# Patient Record
Sex: Male | Born: 1970 | Race: Black or African American | Hispanic: No | Marital: Single | Smoking: Current every day smoker
Health system: Southern US, Community
[De-identification: ages and names within clinical notes are randomized; demographics above are authoritative.]

## PROBLEM LIST (undated history)

## (undated) DIAGNOSIS — R05 Cough: Secondary | ICD-10-CM

## (undated) DIAGNOSIS — R011 Cardiac murmur, unspecified: Secondary | ICD-10-CM

## (undated) DIAGNOSIS — N62 Hypertrophy of breast: Secondary | ICD-10-CM

## (undated) DIAGNOSIS — R61 Generalized hyperhidrosis: Secondary | ICD-10-CM

## (undated) DIAGNOSIS — B20 Human immunodeficiency virus [HIV] disease: Principal | ICD-10-CM

## (undated) DIAGNOSIS — F17200 Nicotine dependence, unspecified, uncomplicated: Secondary | ICD-10-CM

## (undated) HISTORY — DX: Cardiac murmur, unspecified: R01.1

## (undated) HISTORY — DX: Cough: R05

## (undated) HISTORY — DX: Generalized hyperhidrosis: R61

## (undated) HISTORY — DX: Nicotine dependence, unspecified, uncomplicated: F17.200

## (undated) HISTORY — DX: Hypertrophy of breast: N62

## (undated) HISTORY — DX: Human immunodeficiency virus (HIV) disease: B20

---

## 2002-10-01 ENCOUNTER — Encounter: Payer: Self-pay | Admitting: Emergency Medicine

## 2002-10-01 ENCOUNTER — Emergency Department (HOSPITAL_COMMUNITY): Admission: EM | Admit: 2002-10-01 | Discharge: 2002-10-01 | Payer: Self-pay | Admitting: Emergency Medicine

## 2005-08-12 ENCOUNTER — Ambulatory Visit: Payer: Self-pay | Admitting: Internal Medicine

## 2005-08-13 ENCOUNTER — Ambulatory Visit: Payer: Self-pay | Admitting: *Deleted

## 2005-08-29 ENCOUNTER — Ambulatory Visit: Payer: Self-pay | Admitting: Internal Medicine

## 2005-09-18 ENCOUNTER — Ambulatory Visit: Payer: Self-pay | Admitting: Gastroenterology

## 2005-09-27 ENCOUNTER — Ambulatory Visit: Payer: Self-pay | Admitting: Gastroenterology

## 2005-10-02 ENCOUNTER — Ambulatory Visit: Payer: Self-pay | Admitting: Gastroenterology

## 2005-10-04 ENCOUNTER — Ambulatory Visit: Payer: Self-pay | Admitting: Gastroenterology

## 2006-11-17 ENCOUNTER — Encounter: Payer: Self-pay | Admitting: *Deleted

## 2006-11-17 DIAGNOSIS — J309 Allergic rhinitis, unspecified: Secondary | ICD-10-CM | POA: Insufficient documentation

## 2007-04-26 IMAGING — CT CT PELVIS W/ CM
2 of 5 series · 17 of 46 positions shown, 19 images · IV contrast (APPLIED)
Comparison: none

CLINICAL DATA: History of abdominal pain.
 ABDOMEN CT WITH CONTRAST:
TECHNIQUE: Multidetector CT imaging of the abdomen was performed following the standard protocol during bolus administration of intravenous contrast.
 Contrast:  100 cc Omnipaque 300.
TECHNIQUE: Multidetector CT imaging of the pelvis was performed following the standard protocol during bolus administration of intravenous contrast.

[Series 2: abd_pel 5.0 b30f st · axial · 0.64mm/px · z∈[-467,-67]mm · 14 of 90 slices shown, 16 images]
[im 5/90  soft-tissue]
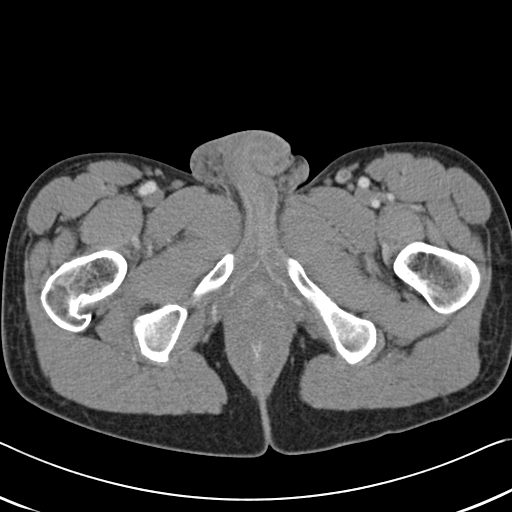
[im 5/90  bone]
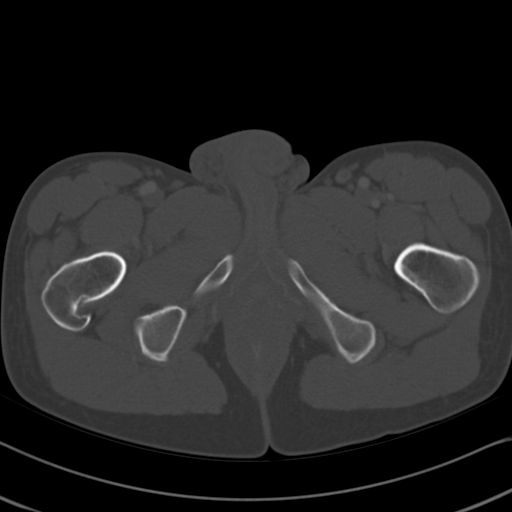
[im 10/90  soft-tissue]
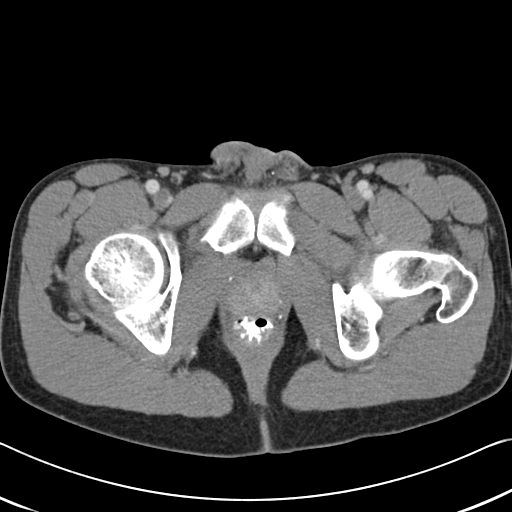
[im 20/90  soft-tissue]
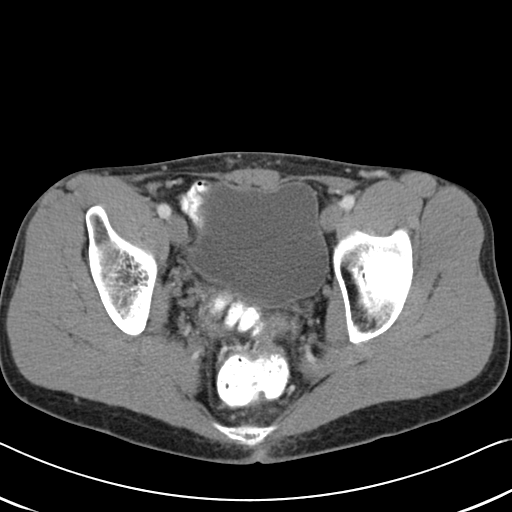
[im 25/90  soft-tissue]
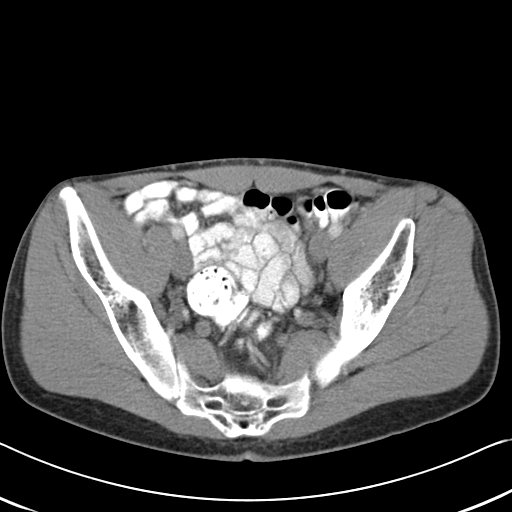
[im 30/90  soft-tissue]
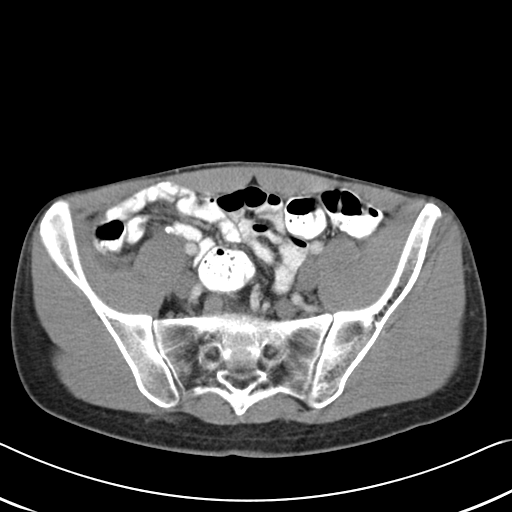
[im 35/90  soft-tissue]
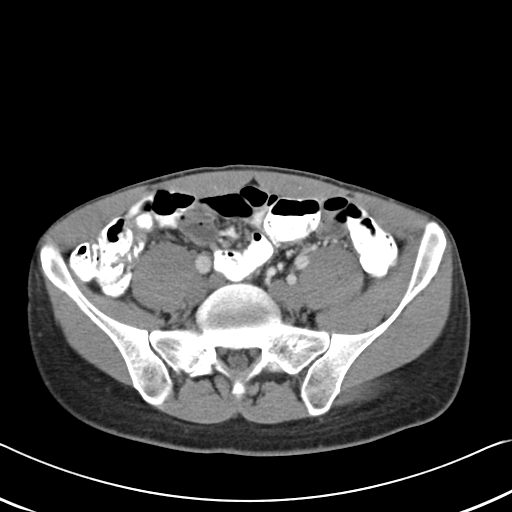
[im 40/90  soft-tissue]
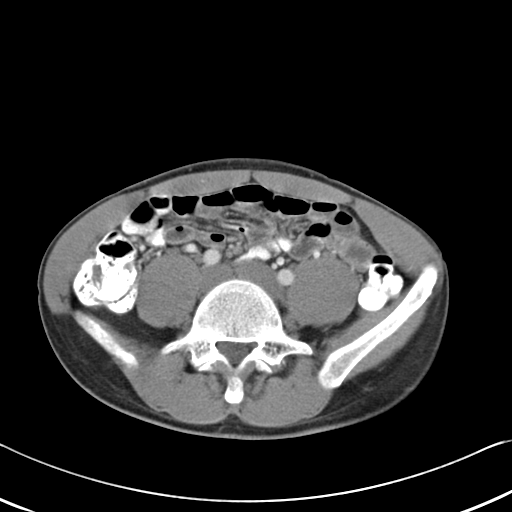
[im 50/90  soft-tissue]
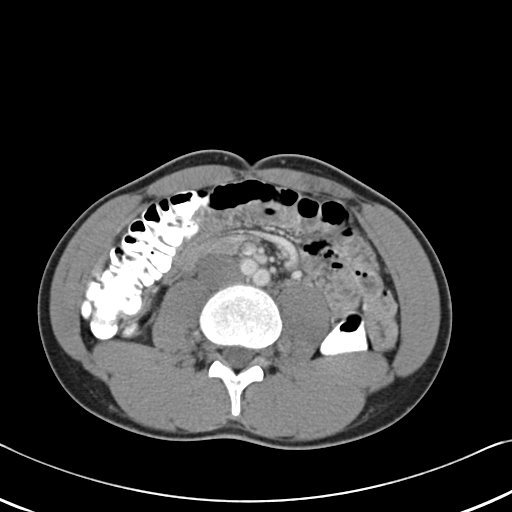
[im 55/90  soft-tissue]
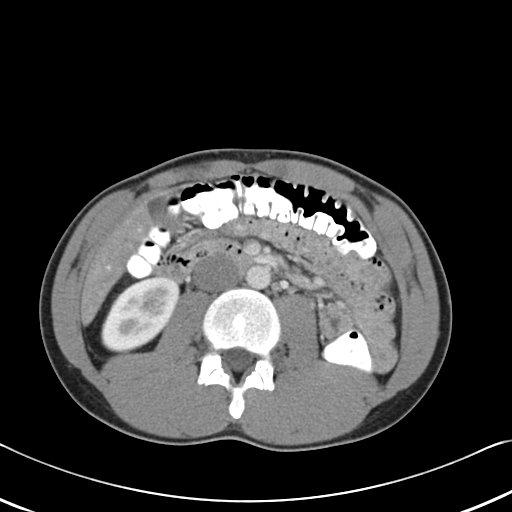
[im 55/90  bone]
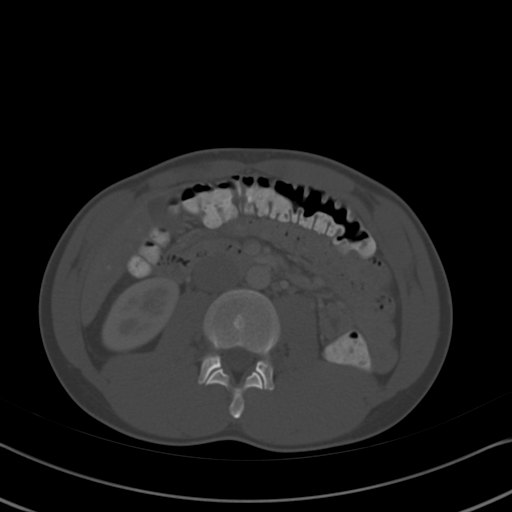
[im 60/90  soft-tissue]
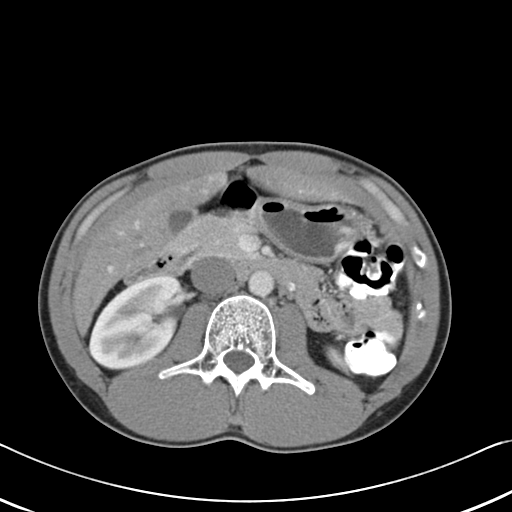
[im 65/90  soft-tissue]
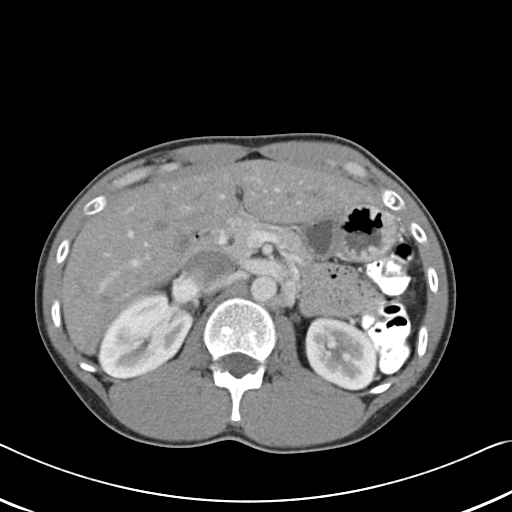
[im 70/90  soft-tissue]
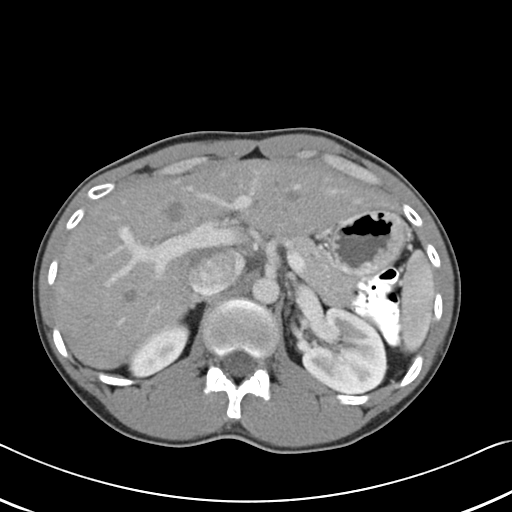
[im 80/90  soft-tissue]
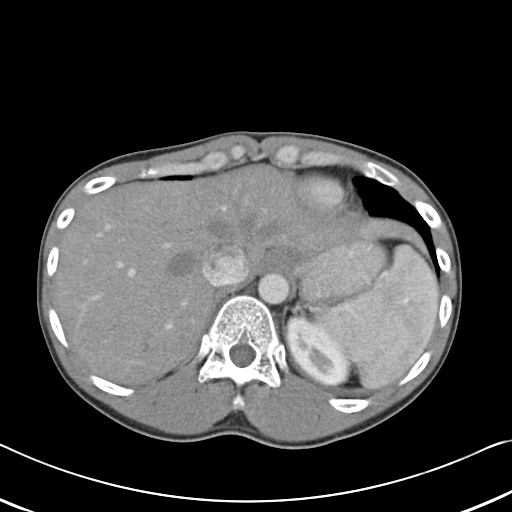
[im 85/90  soft-tissue]
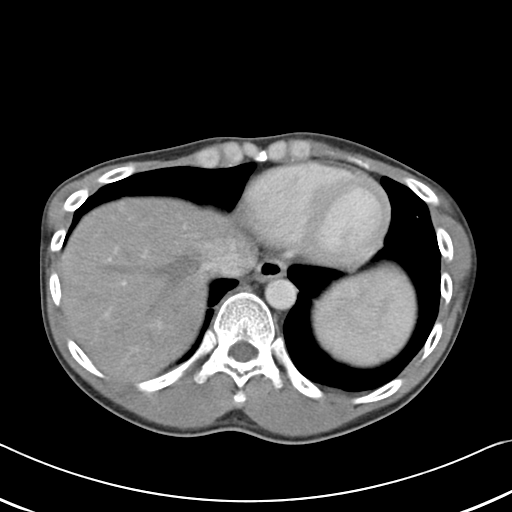

[Series 602: <mpr range> · coronal · 0.88mm/px · 3 of 39 slices shown]
[im 13/39  soft-tissue]
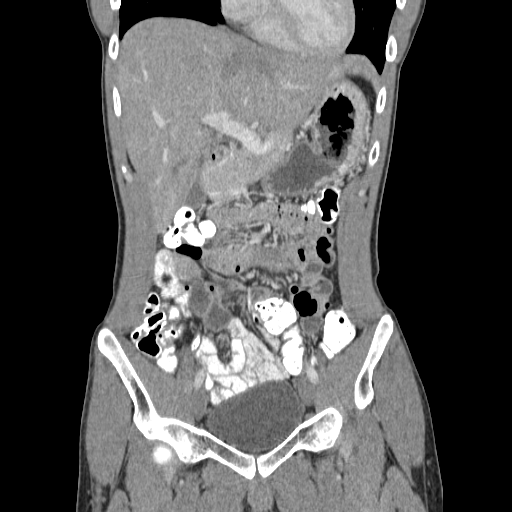
[im 17/39  soft-tissue]
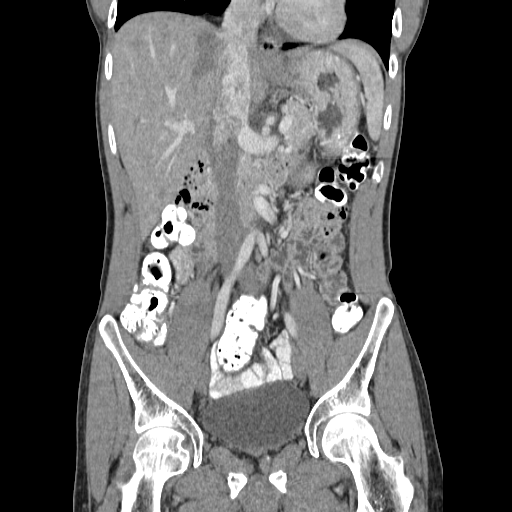
[im 22/39  soft-tissue]
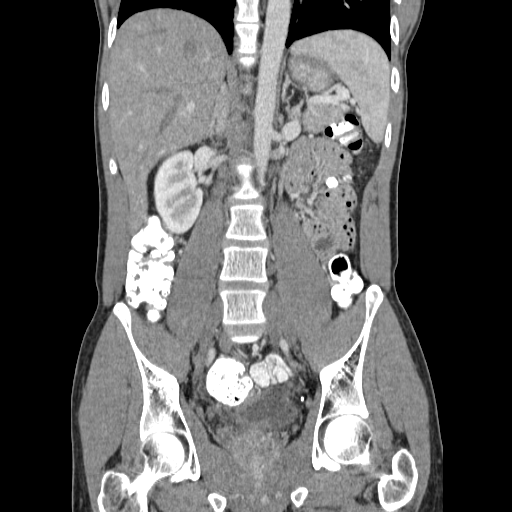

[17 of 46 positions shown; findings below may reference images not displayed]

FINDINGS: The lung bases are clear.  The liver enhances with no focal abnormality, and no ductal dilatation is seen.  No calcified gallstones are noted.  The pancreas is normal in size with normal peripancreatic fat planes.  The adrenal glands and spleen appear normal.  The kidneys enhance normally, and on delayed images the pelvicaliceal systems appear normal.  The abdominal aorta is normal in caliber.  No adenopathy is seen.
IMPRESSION: Negative CT of the abdomen.
 PELVIS CT WITH CONTRAST:
FINDINGS: Scans were continued through the pelvis after oral and IV contrast were given.  No abnormality of the bowel is seen.  The urinary bladder is unremarkable.  The prostate is normal in size.  No pelvic mass or fluid is seen.  
 The appendix and terminal ileum appear normal.
IMPRESSION: Negative CT of the pelvis.  The appendix and terminal ileum appear normal.

## 2016-05-06 ENCOUNTER — Ambulatory Visit: Payer: Self-pay

## 2016-05-06 ENCOUNTER — Other Ambulatory Visit: Payer: Self-pay

## 2016-05-07 ENCOUNTER — Other Ambulatory Visit: Payer: Self-pay

## 2016-05-09 ENCOUNTER — Other Ambulatory Visit (INDEPENDENT_AMBULATORY_CARE_PROVIDER_SITE_OTHER): Payer: Self-pay

## 2016-05-09 DIAGNOSIS — Z79899 Other long term (current) drug therapy: Secondary | ICD-10-CM

## 2016-05-09 DIAGNOSIS — B20 Human immunodeficiency virus [HIV] disease: Secondary | ICD-10-CM

## 2016-05-09 DIAGNOSIS — Z113 Encounter for screening for infections with a predominantly sexual mode of transmission: Secondary | ICD-10-CM

## 2016-05-09 LAB — CBC WITH DIFFERENTIAL/PLATELET
BASOS ABS: 0 {cells}/uL (ref 0–200)
Basophils Relative: 0 %
EOS PCT: 0 %
Eosinophils Absolute: 0 cells/uL — ABNORMAL LOW (ref 15–500)
HCT: 36.3 % — ABNORMAL LOW (ref 38.5–50.0)
Hemoglobin: 12.8 g/dL — ABNORMAL LOW (ref 13.2–17.1)
Lymphocytes Relative: 29 %
Lymphs Abs: 928 cells/uL (ref 850–3900)
MCH: 34.7 pg — AB (ref 27.0–33.0)
MCHC: 35.3 g/dL (ref 32.0–36.0)
MCV: 98.4 fL (ref 80.0–100.0)
MONOS PCT: 9 %
MPV: 8.9 fL (ref 7.5–12.5)
Monocytes Absolute: 288 cells/uL (ref 200–950)
Neutro Abs: 1984 cells/uL (ref 1500–7800)
Neutrophils Relative %: 62 %
PLATELETS: 142 10*3/uL (ref 140–400)
RBC: 3.69 MIL/uL — ABNORMAL LOW (ref 4.20–5.80)
RDW: 15.3 % — AB (ref 11.0–15.0)
WBC: 3.2 10*3/uL — ABNORMAL LOW (ref 3.8–10.8)

## 2016-05-10 LAB — COMPLETE METABOLIC PANEL WITH GFR
ALK PHOS: 175 U/L — AB (ref 40–115)
ALT: 27 U/L (ref 9–46)
AST: 74 U/L — ABNORMAL HIGH (ref 10–40)
Albumin: 4.5 g/dL (ref 3.6–5.1)
BUN: 8 mg/dL (ref 7–25)
CO2: 25 mmol/L (ref 20–31)
Calcium: 9.5 mg/dL (ref 8.6–10.3)
Chloride: 101 mmol/L (ref 98–110)
Creat: 0.88 mg/dL (ref 0.60–1.35)
GFR, Est African American: >89 mL/min (ref >=60)
GLUCOSE: 96 mg/dL (ref 65–99)
POTASSIUM: 3.9 mmol/L (ref 3.5–5.3)
SODIUM: 135 mmol/L (ref 135–146)
Total Bilirubin: 0.4 mg/dL (ref 0.2–1.2)
Total Protein: 8.6 g/dL — ABNORMAL HIGH (ref 6.1–8.1)

## 2016-05-10 LAB — T-HELPER CELL (CD4) - (RCID CLINIC ONLY)
CD4 T CELL ABS: 1020 /uL (ref 400–2700)
CD4 T CELL HELPER: 35 % (ref 33–55)

## 2016-05-10 LAB — RPR

## 2016-05-10 LAB — URINE CYTOLOGY ANCILLARY ONLY
CHLAMYDIA, DNA PROBE: NEGATIVE
Neisseria Gonorrhea: NEGATIVE

## 2016-05-10 LAB — HEPATITIS B SURFACE ANTIBODY,QUALITATIVE: Hep B S Ab: NEGATIVE

## 2016-05-10 LAB — LIPID PANEL
CHOLESTEROL: 181 mg/dL (ref <200)
HDL: 37 mg/dL — ABNORMAL LOW (ref >40)
TRIGLYCERIDES: 622 mg/dL — AB (ref <150)
Total CHOL/HDL Ratio: 4.9 Ratio (ref <5.0)

## 2016-05-10 LAB — HEPATITIS C ANTIBODY: HCV AB: NEGATIVE

## 2016-05-10 LAB — HEPATITIS B CORE ANTIBODY, TOTAL: HEP B C TOTAL AB: NONREACTIVE

## 2016-05-10 LAB — HEPATITIS B SURFACE ANTIGEN: Hepatitis B Surface Ag: NEGATIVE

## 2016-05-10 LAB — HEPATITIS A ANTIBODY, TOTAL: HEP A TOTAL AB: REACTIVE — AB

## 2016-05-11 LAB — QUANTIFERON TB GOLD ASSAY (BLOOD)
Interferon Gamma Release Assay: NEGATIVE
MITOGEN-NIL SO: 1.95 [IU]/mL
QUANTIFERON NIL VALUE: 0.04 [IU]/mL

## 2016-05-12 LAB — HIV-1 RNA,QN PCR W/REFLEX GENOTYPE
HIV-1 RNA, QN PCR: 2.7 Log cps/mL — ABNORMAL HIGH
HIV-1 RNA, QN PCR: 500 Copies/mL — ABNORMAL HIGH

## 2016-05-14 ENCOUNTER — Encounter: Payer: Self-pay | Admitting: Infectious Disease

## 2016-05-20 ENCOUNTER — Encounter: Payer: Self-pay | Admitting: Infectious Disease

## 2016-05-20 ENCOUNTER — Ambulatory Visit (INDEPENDENT_AMBULATORY_CARE_PROVIDER_SITE_OTHER): Payer: Self-pay | Admitting: Infectious Disease

## 2016-05-20 VITALS — BP 129/74 | HR 114 | Temp 98.6°F | Wt 152.0 lb

## 2016-05-20 DIAGNOSIS — B20 Human immunodeficiency virus [HIV] disease: Secondary | ICD-10-CM | POA: Insufficient documentation

## 2016-05-20 DIAGNOSIS — Z21 Asymptomatic human immunodeficiency virus [HIV] infection status: Secondary | ICD-10-CM

## 2016-05-20 DIAGNOSIS — F17200 Nicotine dependence, unspecified, uncomplicated: Secondary | ICD-10-CM | POA: Insufficient documentation

## 2016-05-20 DIAGNOSIS — R011 Cardiac murmur, unspecified: Secondary | ICD-10-CM

## 2016-05-20 DIAGNOSIS — R059 Cough, unspecified: Secondary | ICD-10-CM

## 2016-05-20 DIAGNOSIS — R634 Abnormal weight loss: Secondary | ICD-10-CM

## 2016-05-20 DIAGNOSIS — R61 Generalized hyperhidrosis: Secondary | ICD-10-CM

## 2016-05-20 DIAGNOSIS — R05 Cough: Secondary | ICD-10-CM

## 2016-05-20 HISTORY — DX: Generalized hyperhidrosis: R61

## 2016-05-20 HISTORY — DX: Nicotine dependence, unspecified, uncomplicated: F17.200

## 2016-05-20 HISTORY — DX: Asymptomatic human immunodeficiency virus (hiv) infection status: Z21

## 2016-05-20 HISTORY — DX: Cardiac murmur, unspecified: R01.1

## 2016-05-20 HISTORY — DX: Cough, unspecified: R05.9

## 2016-05-20 HISTORY — DX: Human immunodeficiency virus (HIV) disease: B20

## 2016-05-20 LAB — CBC WITH DIFFERENTIAL/PLATELET
BASOS PCT: 0 %
Basophils Absolute: 0 cells/uL (ref 0–200)
EOS PCT: 0 %
Eosinophils Absolute: 0 cells/uL — ABNORMAL LOW (ref 15–500)
HEMATOCRIT: 35.6 % — AB (ref 38.5–50.0)
Hemoglobin: 12.4 g/dL — ABNORMAL LOW (ref 13.2–17.1)
LYMPHS PCT: 35 %
Lymphs Abs: 1085 cells/uL (ref 850–3900)
MCH: 34.5 pg — ABNORMAL HIGH (ref 27.0–33.0)
MCHC: 34.8 g/dL (ref 32.0–36.0)
MCV: 99.2 fL (ref 80.0–100.0)
MONO ABS: 403 {cells}/uL (ref 200–950)
MONOS PCT: 13 %
MPV: 9.1 fL (ref 7.5–12.5)
Neutro Abs: 1612 cells/uL (ref 1500–7800)
Neutrophils Relative %: 52 %
PLATELETS: 167 10*3/uL (ref 140–400)
RBC: 3.59 MIL/uL — AB (ref 4.20–5.80)
RDW: 15.5 % — AB (ref 11.0–15.0)
WBC: 3.1 10*3/uL — AB (ref 3.8–10.8)

## 2016-05-20 MED ORDER — EMTRICITABINE-TENOFOVIR AF 200-25 MG PO TABS: 1.0000 | ORAL_TABLET | Freq: Every day | ORAL | 5 refills | Status: DC

## 2016-05-20 MED ORDER — DOLUTEGRAVIR SODIUM 50 MG PO TABS: 50.0000 mg | ORAL_TABLET | Freq: Every day | ORAL | 5 refills | Status: DC

## 2016-05-20 NOTE — Progress Notes (Signed)
HPI: Bradley Hamilton is a 46 y.o. male who presents to the [facility] clinic today to see Dr. Daiva Eves for his HIV.  Allergies: No Known Allergies  Past Medical History: Past Medical History:  Diagnosis Date  . Cough 05/20/2016  . Heart murmur 05/20/2016  . HIV infection (HCC) 05/20/2016  . Night sweats 05/20/2016  . Smoker 05/20/2016    Social History: Social History   Social History  . Marital status: Single    Spouse name: N/A  . Number of children: N/A  . Years of education: N/A   Social History Main Topics  . Smoking status: Current Every Day Smoker  . Smokeless tobacco: Never Used  . Alcohol use Yes  . Drug use: No  . Sexual activity: Not Currently   Other Topics Concern  . None   Social History Narrative  . None    Current Regimen: none  Labs: CD4 T Cell Abs (/uL)  Date Value  05/10/2016 1,020   Hep B S Ab (no units)  Date Value  05/09/2016 NEG   Hepatitis B Surface Ag (no units)  Date Value  05/09/2016 NEGATIVE   HCV Ab (no units)  Date Value  05/09/2016 NEGATIVE    CrCl: CrCl cannot be calculated (Unknown ideal weight.).  Lipids:    Component Value Date/Time   CHOL 181 05/09/2016 1609   TRIG 622 (H) 05/09/2016 1609   HDL 37 (L) 05/09/2016 1609   CHOLHDL 4.9 05/09/2016 1609   VLDL NOT CALC 05/09/2016 1609   LDLCALC NOT CALC 05/09/2016 1609    Assessment: Bradley Hamilton comes into clinic today to see Dr. Daiva Eves.  He has had HIV for 16 years but has never been on medications.  His viral load is only 500 and CD4 normal above 1,000. He is interested in starting medications now.  Explained to him the importance of not stopping his medications once he starts. Explained resistance to him and how it can develop if he goes on and off medications.  Also explained the importance of being on HIV medications to prevent long term inflammation and problems down the road. He is ready to start.  His ADAP is pending so we will do Tivicay and Descovy so he can go  through [employer].  We will change to [place] once his ADAP is approved [employer] does not have [place] on formulary currently). I filled out his [employer] application for him and told him to expect to hear from them soon. I will have him come back and see me in ~1 month to see how he is doing and get labs.  Plans: - Tivicay 50 mg PO once daily from [facility] - Descovy 200-25 mg PO once daily from [employer] - Change to [place] once ADAP approved - F/u with me 5/9 at 2pm  Zenaya Ulatowski L. Oluwademilade Mckiver, PharmD, CPP Infectious Diseases Clinical Pharmacist [facility] 05/20/2016, 3:21 PM

## 2016-05-20 NOTE — Progress Notes (Signed)
Chief complaint: night sweats   Subjective:    Patient ID: Bradley Hamilton, male    DOB: November 18, 1970, 46 y.o.   MRN: [id number]  HPI  46 year old [place] American man with HIV infection who moved from [place], [place] to [place] because famiily is here. He has had HIV infection x 45 years but never been on meds. His VL is only 500 and CD4 over 1k.  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  He is interested in starting on medications.   He is concerned because for past several months he has been suffering from night sweats where his pillow is soaked in sweat as is his chest. He does not believe he has lost weight but family have told him they think he has. He has cough as well but largely non productive.   On exam he has  46 y.o. A very loud systolic murmur that is palpable. He claims no one has ever told him he had a murmur.   Past Medical History:  Diagnosis Date  . Cough 05/20/2016  . Heart murmur 05/20/2016  . HIV infection ([place]) 05/20/2016  . Night sweats 05/20/2016  . Smoker 05/20/2016    No past surgical history on file.  No family history on file.    Social History   Social History  . Marital status: Single    Spouse name: N/A  . Number of children: N/A  . Years of education: N/A   Social History Main Topics  . Smoking status: Current Every Day Smoker  . Smokeless tobacco: Never Used  . Alcohol use Yes  . Drug use: No  . Sexual activity: Not Currently   Other Topics Concern  . None   Social History Narrative  . None    No Known Allergies   Current Outpatient Prescriptions:  .  dolutegravir (TIVICAY) 50 MG tablet, Take 1 tablet (50 mg total) by mouth daily., Disp: 30 tablet, Rfl: 5 .  emtricitabine-tenofovir AF (DESCOVY) 200-25 MG tablet, Take 1 tablet by mouth daily., Disp: 30 tablet, Rfl: 5   Review of Systems  Constitutional: Positive for diaphoresis and unexpected weight change. Negative for fever.  HENT:  Negative for congestion and sore throat.   Eyes: Negative for photophobia.  Respiratory: Positive for cough. Negative for shortness of breath and wheezing.   Cardiovascular: Negative for chest pain, palpitations and leg swelling.  Gastrointestinal: Negative for abdominal pain, blood in stool, constipation, diarrhea, nausea and vomiting.  Genitourinary: Negative for dysuria, flank pain and hematuria.  Musculoskeletal: Negative for back pain and myalgias.  Skin: Negative for rash.  Neurological: Negative for dizziness, weakness and headaches.  Hematological: Does not bruise/bleed easily.  Psychiatric/Behavioral: Negative for suicidal ideas.       Objective:   Physical Exam  Constitutional: He is oriented to person, place, and time. No distress.  HENT:  Head: Normocephalic and atraumatic.  Mouth/Throat: Oropharynx is clear and moist. No oropharyngeal exudate.  Eyes: EOM are normal. Pupils are equal, round, and reactive to light.  Neck: No tracheal deviation present. No thyromegaly present.  Cardiovascular: A regularly irregular rhythm present. Tachycardia present.   Murmur heard.  Systolic murmur is present with a grade of 4/6  Pulmonary/Chest: Effort normal and breath sounds normal. No respiratory distress. He has no wheezes. He has no rales. He exhibits no tenderness.  Abdominal: Soft. Bowel sounds are normal. He exhibits no distension. There is no tenderness.  Musculoskeletal: Normal  range of motion.  Lymphadenopathy:    He has no cervical adenopathy.  Neurological: He is alert and oriented to person, place, and time.  Skin: Skin is warm and dry. He is not diaphoretic.  Psychiatric: He has a normal mood and affect. His behavior is normal. Judgment and thought content normal.          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 There was data from our ACTG 5308 study that elite controllers benefit from ARV to reduce increased inflammatino they  have compared to HIV + who are not elite controllers but who are on meds.   Will do [employer] and Tivicay and Descovy --> BIKTARVY when he is on ADAP  Vaccinate for Hep B and Meningococcus  Night sweats, weight loss, cough and loud murmur: he denies IVDU. ( He would not let me examine his feet because he has toenails that need to be cut)  I am VERY worried about Endocarditis but pt did not want me to admit tot he hospital. I will check blood cultures x 2 sites, ESR< CRP, repeat CMP and CBC and CXR  Murmur: see above  Smoker: needs to quit, counselled   46 spent greater than 60 minutes with the patient including greater than 50% of time in face to face counsel of the patient re his HIV, ARV regimen, night sweats, cough, possible weight loss, heart murmur and in coordination of his  care.

## 2016-05-21 DIAGNOSIS — Z23 Encounter for immunization: Secondary | ICD-10-CM

## 2016-05-21 DIAGNOSIS — Z21 Asymptomatic human immunodeficiency virus [HIV] infection status: Secondary | ICD-10-CM

## 2016-05-21 LAB — COMPLETE METABOLIC PANEL WITH GFR
ALBUMIN: 4.5 g/dL (ref 3.6–5.1)
ALK PHOS: 175 U/L — AB (ref 40–115)
ALT: 29 U/L (ref 9–46)
AST: 65 U/L — ABNORMAL HIGH (ref 10–40)
BUN: 12 mg/dL (ref 7–25)
CALCIUM: 9.6 mg/dL (ref 8.6–10.3)
CHLORIDE: 96 mmol/L — AB (ref 98–110)
CO2: 17 mmol/L — ABNORMAL LOW (ref 20–31)
CREATININE: 0.83 mg/dL (ref 0.60–1.35)
GFR, Est Non African American: >89 mL/min (ref >=60)
Glucose, Bld: 89 mg/dL (ref 65–99)
Potassium: 3.7 mmol/L (ref 3.5–5.3)
Sodium: 134 mmol/L — ABNORMAL LOW (ref 135–146)
Total Bilirubin: 0.4 mg/dL (ref 0.2–1.2)
Total Protein: 8.6 g/dL — ABNORMAL HIGH (ref 6.1–8.1)

## 2016-05-21 LAB — C-REACTIVE PROTEIN: CRP: 1.5 mg/L (ref <8.0)

## 2016-05-21 LAB — SEDIMENTATION RATE: Sed Rate: 24 mm/hr — ABNORMAL HIGH (ref 0–15)

## 2016-05-21 NOTE — Addendum Note (Signed)
Addended by: Lurlean Leyden on: 05/21/2016 12:36 PM   Modules accepted: Orders

## 2016-05-23 LAB — HLA B*5701: HLA-B 5701 W/RFLX HLA-B HIGH: POSITIVE — AB

## 2016-05-27 LAB — CULTURE, BLOOD (SINGLE)
Organism ID, Bacteria: NO GROWTH
Organism ID, Bacteria: NO GROWTH

## 2016-06-06 ENCOUNTER — Encounter: Payer: Self-pay | Admitting: *Deleted

## 2016-06-12 ENCOUNTER — Ambulatory Visit: Payer: Self-pay

## 2016-06-14 ENCOUNTER — Ambulatory Visit: Payer: Self-pay

## 2016-06-17 ENCOUNTER — Ambulatory Visit: Payer: Self-pay

## 2016-07-26 ENCOUNTER — Other Ambulatory Visit: Payer: Self-pay | Admitting: Pharmacist

## 2016-07-26 ENCOUNTER — Telehealth: Payer: Self-pay | Admitting: Pharmacist

## 2016-07-26 DIAGNOSIS — B20 Human immunodeficiency virus [HIV] disease: Secondary | ICD-10-CM

## 2016-07-26 MED ORDER — EMTRICITABINE-TENOFOVIR AF 200-25 MG PO TABS: 1.0000 | ORAL_TABLET | Freq: Every day | ORAL | 5 refills | Status: DC

## 2016-07-26 MED ORDER — DOLUTEGRAVIR SODIUM 50 MG PO TABS: 50.0000 mg | ORAL_TABLET | Freq: Every day | ORAL | 5 refills | Status: DC

## 2016-07-26 NOTE — Telephone Encounter (Signed)
[facility] Bradley [facility].  She said he called her saying that he saw Bradley Hamilton in April and was signed up for [facility] [facility] (I signed him up) but has gotten no medication since then.  Looked on [employer] [facility] and he was enrolled on 4/18 and it was shipped to his address which is the same address in [place].  Bradley Bossnnie also said he is worried because now he is throwing up blood. Bradley Hamilton to tell patient that he should have let Bradley Hamilton know sooner if he never got the medications. He also had a pharmacy appt on 5/14 but no showed. Also told Bradley Bossnnie to tell patient to go to the ED if he is throwing up blood and to call Bradley Hamilton ASAP to get seen in the clinic.  His ADAP is approved now, so [facility] Will send medications there right now.

## 2016-07-28 NOTE — Telephone Encounter (Signed)
Thanks so much Cassie. If he is admittted it would also be a good idea for ID MD doing Consults at [facility] to see him briefly.

## 2016-07-29 ENCOUNTER — Ambulatory Visit: Payer: Self-pay

## 2016-08-01 ENCOUNTER — Ambulatory Visit: Payer: Self-pay

## 2016-08-02 ENCOUNTER — Ambulatory Visit (INDEPENDENT_AMBULATORY_CARE_PROVIDER_SITE_OTHER): Payer: Self-pay | Admitting: Pharmacist

## 2016-08-02 DIAGNOSIS — B20 Human immunodeficiency virus [HIV] disease: Secondary | ICD-10-CM

## 2016-08-02 MED ORDER — BICTEGRAVIR-EMTRICITAB-TENOFOV 50-200-25 MG PO TABS: 1.0000 | ORAL_TABLET | Freq: Every day | ORAL | 5 refills | Status: DC

## 2016-08-02 NOTE — Progress Notes (Signed)
HPI: Bradley Hamilton is a 46 y.o. male who presents to the [facility] pharmacy clinic for HIV follow-up.   Allergies: No Known Allergies  Past Medical History: Past Medical History:  Diagnosis Date  . Cough 05/20/2016  . Heart murmur 05/20/2016  . HIV infection (HCC) 05/20/2016  . Night sweats 05/20/2016  . Smoker 05/20/2016    Social History: Social History   Social History  . Marital status: Single    Spouse name: N/A  . Number of children: N/A  . Years of education: N/A   Social History Main Topics  . Smoking status: Current Every Day Smoker  . Smokeless tobacco: Never Used  . Alcohol use Yes  . Drug use: No  . Sexual activity: Not Currently   Other Topics Concern  . Not on file   Social History Narrative  . No narrative on file    Current Regimen: none  Labs: CD4 T Cell Abs (/uL)  Date Value  05/10/2016 1,020   Hep B S Ab (no units)  Date Value  05/09/2016 NEG   Hepatitis B Surface Ag (no units)  Date Value  05/09/2016 NEGATIVE   HCV Ab (no units)  Date Value  05/09/2016 NEGATIVE    CrCl: CrCl cannot be calculated (Patient's most recent lab result is older than the maximum 21 days allowed.).  Lipids:    Component Value Date/Time   CHOL 181 05/09/2016 1609   TRIG 622 (H) 05/09/2016 1609   HDL 37 (L) 05/09/2016 1609   CHOLHDL 4.9 05/09/2016 1609   VLDL NOT CALC 05/09/2016 1609   LDLCALC NOT CALC 05/09/2016 1609    Assessment: Bradley Hamilton is here today to follow-up with me for his HIV infection.  I saw him back in April with Dr. Daiva EvesVan Dam as a new patient.  He has had HIV for a long time but has never been on medications.  I signed him up for [facility] at that time.  He tells me today that he [facility] even though the address was correct and they had a fill date when I checked in the system.  His ADAP is approved now.  We discussed switching from Tivicay and Descovy to [place] now since it is on the ADAP formulary. He  is agreeable to this.  He will get [facility] to help him renew his ADAP (this was his preference).  [facility] and he will pick up tomorrow.   He also said that when he said he was throwing up blood that it was just one time and it was a very small amount. None since. I will have him come back and see me in 3-4 weeks to see how he is doing and make an appointment for him to see Dr. Daiva EvesVan Dam.   Plans: - Start Biktarvy PO once daily - F/u with me 7/31 at 1:30pm - F/u with Dr. Daiva EvesVan Dam 9/10 at 2:30pm  Cassie L. Kuppelweiser, PharmD, CPP Infectious Diseases Clinical Pharmacist [facility] 08/02/2016, 1:39 PM

## 2016-09-03 ENCOUNTER — Ambulatory Visit (INDEPENDENT_AMBULATORY_CARE_PROVIDER_SITE_OTHER): Payer: Self-pay | Admitting: Pharmacist

## 2016-09-03 ENCOUNTER — Encounter: Payer: Self-pay | Admitting: Infectious Disease

## 2016-09-03 DIAGNOSIS — B20 Human immunodeficiency virus [HIV] disease: Secondary | ICD-10-CM

## 2016-09-03 NOTE — Progress Notes (Signed)
HPI: Bradley Hamilton is a 45 y.o. male who presents to the [facility] pharmacy clinic for HIV follow-up.   Allergies: No Known Allergies  Past Medical History: Past Medical History:  Diagnosis Date  . Cough 05/20/2016  . Heart murmur 05/20/2016  . HIV infection (HCC) 05/20/2016  . Night sweats 05/20/2016  . Smoker 05/20/2016    Social History: Social History   Social History  . Marital status: Single    Spouse name: N/A  . Number of children: N/A  . Years of education: N/A   Social History Main Topics  . Smoking status: Current Every Day Smoker  . Smokeless tobacco: Never Used  . Alcohol use Yes  . Drug use: No  . Sexual activity: Not Currently   Other Topics Concern  . Not on file   Social History Narrative  . No narrative on file    Current Regimen: Biktarvy  Labs: CD4 T Cell Abs (/uL)  Date Value  05/10/2016 1,020   Hep B S Ab (no units)  Date Value  05/09/2016 NEG   Hepatitis B Surface Ag (no units)  Date Value  05/09/2016 NEGATIVE   HCV Ab (no units)  Date Value  05/09/2016 NEGATIVE    CrCl: CrCl cannot be calculated (Patient's most recent lab result is older than the maximum 21 days allowed.).  Lipids:    Component Value Date/Time   CHOL 181 05/09/2016 1609   TRIG 622 (H) 05/09/2016 1609   HDL 37 (L) 05/09/2016 1609   CHOLHDL 4.9 05/09/2016 1609   VLDL NOT CALC 05/09/2016 1609   LDLCALC NOT CALC 05/09/2016 1609    Assessment: Bradley Hamilton is here today to follow-up for his HIV infection after recently starting Biktarvy. He has been taking Biktarvy for ~1 month now with no issues.  He is not having any side effects but does notice some chills off and on during the day.  He states he has missed 1-2 doses since starting.  I asked him why and he stated that he forgot.  Educated him again on the importance of compliance and not missing any doses.  He tells me he was told by his mental health doctor that he had high blood pressure. He states he cannot  afford a PCP as he is uninsured, so he asked if I could check his BP.  I did and it was great at 116/78, HR 87.  I told him he didn't need to worry about his BP right now that it was good to go.  He met with [facility] last Monday to renew his ADAP and he is working on becoming an insurance broker. He feels much better after getting on medications.  He also takes Risperdal and Prozac for his mental health disorders. He sees Dr. Van Dam in September. I will get labs today since he has been on medications for ~1 month now.   Plans: - Continue Biktarvy PO once daily - HIV RNA today - F/u with Dr. Van Dam 9/10 at 230pm   L. , PharmD, CPP Infectious Diseases Clinical Pharmacist [facility] 09/03/2016, 2:33 PM 

## 2016-09-05 LAB — HIV-1 RNA QUANT-NO REFLEX-BLD
HIV 1 RNA Quant: <20 copies/mL — AB
HIV-1 RNA QUANT, LOG: DETECTED {Log_copies}/mL — AB

## 2016-10-14 ENCOUNTER — Encounter: Payer: Self-pay | Admitting: Infectious Disease

## 2016-10-14 ENCOUNTER — Ambulatory Visit (INDEPENDENT_AMBULATORY_CARE_PROVIDER_SITE_OTHER): Payer: Self-pay | Admitting: Infectious Disease

## 2016-10-14 VITALS — BP 125/84 | HR 80 | Temp 97.6°F | Wt 145.0 lb

## 2016-10-14 DIAGNOSIS — B20 Human immunodeficiency virus [HIV] disease: Secondary | ICD-10-CM

## 2016-10-14 DIAGNOSIS — Z23 Encounter for immunization: Secondary | ICD-10-CM

## 2016-10-14 DIAGNOSIS — K648 Other hemorrhoids: Secondary | ICD-10-CM

## 2016-10-14 DIAGNOSIS — R011 Cardiac murmur, unspecified: Secondary | ICD-10-CM

## 2016-10-14 DIAGNOSIS — F17200 Nicotine dependence, unspecified, uncomplicated: Secondary | ICD-10-CM

## 2016-10-14 DIAGNOSIS — R61 Generalized hyperhidrosis: Secondary | ICD-10-CM

## 2016-10-14 MED ORDER — VARENICLINE TARTRATE 1 MG PO TABS: 1.0000 mg | ORAL_TABLET | Freq: Two times a day (BID) | ORAL | 1 refills | Status: DC

## 2016-10-14 MED ORDER — VARENICLINE TARTRATE 0.5 MG X 11 & 1 MG X 42 PO MISC: ORAL | 0 refills | Status: DC

## 2016-10-14 MED ORDER — BICTEGRAVIR-EMTRICITAB-TENOFOV 50-200-25 MG PO TABS: 1.0000 | ORAL_TABLET | Freq: Every day | ORAL | 11 refills | Status: DC

## 2016-10-14 NOTE — Progress Notes (Signed)
Chief complaint: hemorrhoids   Subjective:    Patient ID: Bradley Hamilton, male    DOB: 06/28/1970, 46 y.o.   MRN: [id number]  HPI  46 year old [place] American man with HIV infection who moved from [place], [place] to [place] because famiily is here. He has had HIV infection x 16 years but never been on meds. His VL was only 500 and CD4 over 1k.  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  He is interested in starting on medications.   He had been  concerned because for past several months prior to last visit he had been suffering from night sweats where his pillow is soaked in sweat as is his chest. He did not believe he has lost weight but family have told him they thin he has. He had cough as well but largely non productive.   On exam he has  A very loud systolic murmur that was palpable. He claimed no one has ever told him he had a murmur.   I did blood cx which were NGTD x 7 days. He refused admission for TTE etc.   Since I last saw him he denies fevers, chills but still has night sweats. He still is smoking. Weight is stable. He is c/o discomfort from a hemorrhoid and wants medicine to "get rid of it." I explained that I can give cream for itching but cannot get rid of this and this would require surgery or banding at least.     Past Medical History:  Diagnosis Date  . Cough 05/20/2016  . Heart murmur 05/20/2016  . HIV infection (HCC) 05/20/2016  . Night sweats 05/20/2016  . Smoker 05/20/2016    No past surgical history on file.  No family history on file.    Social History   Social History  . Marital status: Single    Spouse name: N/A  . Number of children: N/A  . Years of education: N/A   Social History Main Topics  . Smoking status: Current Every Day Smoker  . Smokeless tobacco: Never Used  . Alcohol use Yes  . Drug use: No  . Sexual activity: Not Currently   Other Topics Concern  . Not on file    Social History Narrative  . No narrative on file    No Known Allergies   Current Outpatient Prescriptions:  .  bictegravir-emtricitabine-tenofovir AF (BIKTARVY) 50-200-25 MG TABS tablet, Take 1 tablet by mouth daily., Disp: 30 tablet, Rfl: 5 .  FLUoxetine (PROZAC) 20 MG tablet, Take 20 mg by mouth daily., Disp: , Rfl:  .  risperidone (RISPERDAL) 4 MG tablet, Take 4 mg by mouth daily., Disp: , Rfl:    Review of Systems  Constitutional: Positive for diaphoresis. Negative for fever and unexpected weight change.  HENT: Negative for congestion and sore throat.   Eyes: Negative for photophobia.  Respiratory: Positive for cough. Negative for shortness of breath and wheezing.   Cardiovascular: Negative for chest pain, palpitations and leg swelling.  Gastrointestinal: Negative for abdominal pain, blood in stool, constipation, diarrhea, nausea and vomiting.  Genitourinary: Negative for dysuria, flank pain and hematuria.  Musculoskeletal: Negative for back pain and myalgias.  Skin: Negative for rash.  Neurological: Negative for dizziness, weakness and headaches.  Hematological: Does not bruise/bleed easily.  Psychiatric/Behavioral: Negative for suicidal ideas.       Objective:   Physical Exam  Constitutional: He is  oriented to person, place, and time. No distress.  HENT:  Head: Normocephalic and atraumatic.  Mouth/Throat: Oropharynx is clear and moist. No oropharyngeal exudate.  Eyes: Pupils are equal, round, and reactive to light. EOM are normal.  Neck: No tracheal deviation present. No thyromegaly present.  Cardiovascular: A regularly irregular rhythm present. Tachycardia present.   Murmur heard.  Systolic murmur is present with a grade of 4/6  Not a loud a murmur as before best heard LUSB  Pulmonary/Chest: Effort normal and breath sounds normal. No respiratory distress. He has no wheezes. He has no rales. He exhibits no tenderness.  Abdominal: Soft. Bowel sounds are normal. He  exhibits no distension. There is no tenderness.  Musculoskeletal: Normal range of motion.  Lymphadenopathy:    He has no cervical adenopathy.  Neurological: He is alert and oriented to person, place, and time.  Skin: Skin is warm and dry. He is not diaphoretic.  Psychiatric: He has a normal mood and affect. His behavior is normal. Judgment and thought content normal.          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 There was data from our ACTG 5308 study that elite controllers benefit from ARV to reduce increased inflammatino they have compared to HIV + who are not elite controllers but who are on meds.   We will continue Biktarvy    Night sweat and  Murmur: needs an Echo when we can get one but not going to try to convince to come in to the hospital at this point. HIs weight is stable. No fevers and blood cs were no growth   Smoker: needs to quit, counselled and also rx chantix   I spent greater than 25 minutes with the patient including greater than 50% of time in face to face counsel of the patient re his HIV, ARV regimen, night sweats heart murmur, smoking  and in coordination of his  care.

## 2017-01-16 ENCOUNTER — Telehealth: Payer: Self-pay | Admitting: *Deleted

## 2017-01-16 ENCOUNTER — Encounter: Payer: Self-pay | Admitting: Internal Medicine

## 2017-01-16 NOTE — Telephone Encounter (Signed)
Patient called stating he missed three days of work due to bloody diarrhea and is requesting a work note. Work note written and signed by Dr. Orvan Falconerampbell and placed upfront for pick up. Patient aware

## 2017-02-10 ENCOUNTER — Ambulatory Visit: Payer: Self-pay | Admitting: Infectious Disease

## 2017-08-20 ENCOUNTER — Telehealth: Payer: Self-pay

## 2017-08-20 NOTE — Telephone Encounter (Signed)
[facility] case manager Dorene Grebeatalie called today to hep pt schedule an appt to for labs and an office visit with Dr. Zenaida NieceVan dam. PT was able to speak with case manager on the line and was able to confirm both appts. Pt stated during the call he is doing well on meds and has not missed any days of medication. PT stated he just felt good and did not believe he should have to f/u with our office. Case manager verbalized importance of staying in care to monitor his HIV and that it is important to schedule and keep appts. I was able to also reemphasize the importance of staying in care and to staying on top of his health and monitoring his HIV.   Bradley [place], [place]

## 2017-08-21 ENCOUNTER — Other Ambulatory Visit: Payer: Self-pay

## 2017-08-27 ENCOUNTER — Other Ambulatory Visit: Payer: Self-pay

## 2017-08-27 DIAGNOSIS — R011 Cardiac murmur, unspecified: Secondary | ICD-10-CM

## 2017-08-27 DIAGNOSIS — F17200 Nicotine dependence, unspecified, uncomplicated: Secondary | ICD-10-CM

## 2017-08-27 DIAGNOSIS — B20 Human immunodeficiency virus [HIV] disease: Secondary | ICD-10-CM

## 2017-08-27 DIAGNOSIS — R61 Generalized hyperhidrosis: Secondary | ICD-10-CM

## 2017-08-28 ENCOUNTER — Ambulatory Visit: Payer: Self-pay | Admitting: Infectious Disease

## 2017-08-28 LAB — COMPLETE METABOLIC PANEL WITH GFR
AG Ratio: 1.5 (calc) (ref 1.0–2.5)
ALKALINE PHOSPHATASE (APISO): 201 U/L — AB (ref 40–115)
ALT: 39 U/L (ref 9–46)
AST: 63 U/L — AB (ref 10–40)
Albumin: 4.8 g/dL (ref 3.6–5.1)
BILIRUBIN TOTAL: 0.9 mg/dL (ref 0.2–1.2)
BUN: 15 mg/dL (ref 7–25)
CHLORIDE: 106 mmol/L (ref 98–110)
CO2: 25 mmol/L (ref 20–32)
Calcium: 9.9 mg/dL (ref 8.6–10.3)
Creat: 0.87 mg/dL (ref 0.60–1.35)
GFR, Est African American: 121 mL/min/{1.73_m2} (ref >=60)
GFR, Est Non African American: 104 mL/min/{1.73_m2} (ref >=60)
GLUCOSE: 104 mg/dL — AB (ref 65–99)
Globulin: 3.1 g/dL (calc) (ref 1.9–3.7)
Potassium: 4.1 mmol/L (ref 3.5–5.3)
Sodium: 140 mmol/L (ref 135–146)
Total Protein: 7.9 g/dL (ref 6.1–8.1)

## 2017-08-28 LAB — LIPID PANEL
Cholesterol: 226 mg/dL — ABNORMAL HIGH (ref <200)
HDL: 93 mg/dL (ref >40)
LDL CHOLESTEROL (CALC): 111 mg/dL — AB
Non-HDL Cholesterol (Calc): 133 mg/dL (calc) — ABNORMAL HIGH (ref <130)
TRIGLYCERIDES: 113 mg/dL (ref <150)
Total CHOL/HDL Ratio: 2.4 (calc) (ref <5.0)

## 2017-08-28 LAB — CBC WITH DIFFERENTIAL/PLATELET
Basophils Absolute: 10 cells/uL (ref 0–200)
Basophils Relative: 0.4 %
EOS PCT: 0.4 %
Eosinophils Absolute: 10 cells/uL — ABNORMAL LOW (ref 15–500)
HEMATOCRIT: 33.4 % — AB (ref 38.5–50.0)
HEMOGLOBIN: 12 g/dL — AB (ref 13.2–17.1)
LYMPHS ABS: 754 {cells}/uL — AB (ref 850–3900)
MCH: 35.8 pg — ABNORMAL HIGH (ref 27.0–33.0)
MCHC: 35.9 g/dL (ref 32.0–36.0)
MCV: 99.7 fL (ref 80.0–100.0)
MPV: 9.6 fL (ref 7.5–12.5)
Monocytes Relative: 9 %
NEUTROS ABS: 1591 {cells}/uL (ref 1500–7800)
NEUTROS PCT: 61.2 %
Platelets: 168 10*3/uL (ref 140–400)
RBC: 3.35 10*6/uL — AB (ref 4.20–5.80)
RDW: 14.4 % (ref 11.0–15.0)
Total Lymphocyte: 29 %
WBC mixed population: 234 cells/uL (ref 200–950)
WBC: 2.6 10*3/uL — AB (ref 3.8–10.8)

## 2017-08-28 LAB — T-HELPER CELL (CD4) - (RCID CLINIC ONLY)
CD4 T CELL ABS: 240 /uL — AB (ref 400–2700)
CD4 T CELL HELPER: 29 % — AB (ref 33–55)

## 2017-08-28 LAB — RPR: RPR: NONREACTIVE

## 2017-08-29 LAB — HIV-1 RNA QUANT-NO REFLEX-BLD
HIV 1 RNA QUANT: 29 {copies}/mL — AB
HIV-1 RNA Quant, Log: 1.46 Log copies/mL — ABNORMAL HIGH

## 2017-09-02 ENCOUNTER — Encounter: Payer: Self-pay | Admitting: Family

## 2017-09-02 ENCOUNTER — Ambulatory Visit (INDEPENDENT_AMBULATORY_CARE_PROVIDER_SITE_OTHER): Payer: Self-pay | Admitting: Family

## 2017-09-02 ENCOUNTER — Ambulatory Visit: Payer: Self-pay | Admitting: Infectious Disease

## 2017-09-02 VITALS — BP 161/103 | HR 78 | Temp 98.4°F | Ht 74.0 in | Wt 148.0 lb

## 2017-09-02 DIAGNOSIS — B20 Human immunodeficiency virus [HIV] disease: Secondary | ICD-10-CM

## 2017-09-02 DIAGNOSIS — R030 Elevated blood-pressure reading, without diagnosis of hypertension: Secondary | ICD-10-CM

## 2017-09-02 DIAGNOSIS — F17200 Nicotine dependence, unspecified, uncomplicated: Secondary | ICD-10-CM

## 2017-09-02 DIAGNOSIS — Z23 Encounter for immunization: Secondary | ICD-10-CM

## 2017-09-02 MED ORDER — BICTEGRAVIR-EMTRICITAB-TENOFOV 50-200-25 MG PO TABS
1.0000 | ORAL_TABLET | Freq: Every day | ORAL | 5 refills | Status: DC
Start: 2017-09-02 — End: 2018-01-06

## 2017-09-02 NOTE — Progress Notes (Signed)
Subjective:    Patient ID: Bradley Hamilton, male    DOB: Mar 18, 1970, 47 y.o.   MRN: [id number]  Chief Complaint  Patient presents with  . HIV Positive/AIDS     HPI:  Bradley Hamilton is a 47 y.o. male who presents today for routine follow-up of HIV disease.  Bradley Hamilton was last seen in the office on 10/14/2016 by Dr. Daiva EvesVan Dam.  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  He was initially diagnosed 16 years prior and never been on medication. He was started on Biktarvy.  Most recent blood work completed on 08/27/2017 with a viral load that was undetectable and a CD4 count of 240.  RPR was nonreactive.  His white blood cell count wasH slightly low at 2.6.  His lipid profile showed an LDL of 111 and HDL of 93.  He is due for Prevnar and meningococcal today.  He may need to restart hepatitis B vaccination as his last dose was given in April 2018 with no future doses noted.   Bradley Hamilton is taking his medication as prescribed with no adverse side effects. He has had no missed dosages and has no problems obtaining or taking his medications. His UMAP is being renewed by his case Production designer, theatre/television/filmmanager. Denies fevers, chills, night sweats, headaches, changes in vision, neck pain/stiffness, nausea, diarrhea, vomiting, lesions or rashes.   No Known Allergies    Outpatient Medications Prior to Visit  Medication Sig Dispense Refill  . bictegravir-emtricitabine-tenofovir AF (BIKTARVY) 50-200-25 MG TABS tablet Take 1 tablet by mouth daily. 30 tablet 11  . FLUoxetine (PROZAC) 20 MG tablet Take 20 mg by mouth daily.    . risperidone (RISPERDAL) 4 MG tablet Take 4 mg by mouth daily.    . varenicline (CHANTIX CONTINUING MONTH PAK) 1 MG tablet Take 1 tablet (1 mg total) by mouth 2 (two) times daily. (Patient not taking: Reported on 09/02/2017) 60 tablet 1  . varenicline (CHANTIX STARTING MONTH PAK) 0.5 MG X 11 & 1 MG X 42 tablet 0.5 mg qdaily x3 d, then0.5 mg bid xr 4  days, then1 mg bid (Patient not taking: Reported on 09/02/2017) 53 tablet 0   No facility-administered medications prior to visit.      Past Medical History:  Diagnosis Date  . Cough 05/20/2016  . Heart murmur 05/20/2016  . HIV infection (HCC) 05/20/2016  . Night sweats 05/20/2016  . Smoker 05/20/2016     History reviewed. No pertinent surgical history.     Review of Systems  Constitutional: Negative for appetite change, chills, fatigue, fever and unexpected weight change.  Eyes: Negative for visual disturbance.  Respiratory: Negative for cough, chest tightness, shortness of breath and wheezing.   Cardiovascular: Negative for chest pain and leg swelling.  Gastrointestinal: Negative for abdominal pain, constipation, diarrhea, nausea and vomiting.  Genitourinary: Negative for dysuria, flank pain, frequency, genital sores, hematuria and urgency.  Skin: Negative for rash.  Allergic/Immunologic: Negative for immunocompromised state.  Neurological: Negative for dizziness and headaches.      Objective:    BP (!) 161/103   Pulse 78   Temp 98.4 F (36.9 C)   Ht 6\' 2"  (1.88 m)   Wt 148 lb (67.1 kg)   BMI 19.00 kg/m  Nursing note and vital signs reviewed.  Physical Exam  Constitutional: He is oriented to person, place, and time. He appears well-developed. No distress.  HENT:  Mouth/Throat: Oropharynx is clear  and moist.  Eyes: Conjunctivae are normal.  Neck: Neck supple.  Cardiovascular: Normal rate, regular rhythm and intact distal pulses. Exam reveals no gallop and no friction rub.  Murmur heard. Pulmonary/Chest: Effort normal and breath sounds normal. No respiratory distress. He has no wheezes. He has no rales. He exhibits no tenderness.  Abdominal: Soft. Bowel sounds are normal. There is no tenderness.  Lymphadenopathy:    He has no cervical adenopathy.  Neurological: He is alert and oriented to person, place, and time.  Skin: Skin is warm and dry. No rash noted.    Psychiatric: He has a normal mood and affect. His behavior is normal.       Assessment & Plan:   Problem List Items Addressed This Visit      Other   HIV disease (HCC) - Primary    Bradley Hamilton appears to have well controlled HIV disease with a viral load that is undetectable and CD4 count of 240. He is adherent to his medication regimen and tolerating it with no adverse side effects. He has no symptoms/signs of opportunistic infection or progressing HIV disease through history of physical exam. Prevnar and meningococcal vaccinations updated today. Continue current dosage of Biktarvy. Plan for follow up in 4 months or sooner if needed with blood work 1-2 weeks prior to appointment.       Relevant Medications   bictegravir-emtricitabine-tenofovir AF (BIKTARVY) 50-200-25 MG TABS tablet   Other Relevant Orders   T-helper cell (CD4)- ([facility] clinic only)   HIV 1 RNA quant-no reflex-bld   CBC   Comprehensive metabolic panel   Lipid panel   RPR   Smoker    Bradley Hamilton is not ready to quit smoking at this time and has not been taking Chantix. He remains in the precontemplation stage. Continue to monitor.      Elevated blood pressure reading    Blood pressure elevated today. No signs/symptoms of end organ damage or worst headache of life. Encouraged to monitor blood pressure and if average remains above 140/90 will consider medication.           I have discontinued Bradley Hamilton's varenicline and varenicline. I am also having him maintain his risperidone, FLUoxetine, and bictegravir-emtricitabine-tenofovir AF.   Meds ordered this encounter  Medications  . bictegravir-emtricitabine-tenofovir AF (BIKTARVY) 50-200-25 MG TABS tablet    Sig: Take 1 tablet by mouth daily.    Dispense:  30 tablet    Refill:  5    Order Specific Question:   Supervising Provider    Answer:   Judyann Munson [[id number]]     Follow-up: Return in about 4 months (around 01/03/2018).   Marcos Eke, MSN,  FNP-C Nurse Practitioner [facility] Office phone: [phone] Pager: [phone] [facility] Main number: [phone]

## 2017-09-02 NOTE — Patient Instructions (Signed)
Nice to meet you.  We will continue your current Biktarvy.  Plan to follow up with Dr. Daiva EvesVan Dam in 4 months.   Schedule a time for your flu shot around the start of September.

## 2017-09-02 NOTE — Assessment & Plan Note (Signed)
Mr. Bradley Hamilton is not ready to quit smoking at this time and has not been taking Chantix. He remains in the precontemplation stage. Continue to monitor.

## 2017-09-02 NOTE — Assessment & Plan Note (Signed)
Bradley Hamilton appears to have well controlled HIV disease with a viral load that is undetectable and CD4 count of 240. He is adherent to his medication regimen and tolerating it with no adverse side effects. He has no symptoms/signs of opportunistic infection or progressing HIV disease through history of physical exam. Prevnar and meningococcal vaccinations updated today. Continue current dosage of Biktarvy. Plan for follow up in 4 months or sooner if needed with blood work 1-2 weeks prior to appointment.

## 2017-09-02 NOTE — Assessment & Plan Note (Signed)
Blood pressure elevated today. No signs/symptoms of end organ damage or worst headache of life. Encouraged to monitor blood pressure and if average remains above 140/90 will consider medication.

## 2017-09-11 ENCOUNTER — Encounter: Payer: Self-pay | Admitting: Infectious Disease

## 2017-11-28 ENCOUNTER — Telehealth: Payer: Self-pay

## 2017-11-28 NOTE — Telephone Encounter (Signed)
Received a call today from [facility] in [place], [place]. Stating patient is requesting refills on Chantix and Biktarvy. Patients insurance is not able to be billed since it is a [employer]. Pharmacy unsure what to do next for patient. Called patient to see if he requested refills in [place]. Patient states he did, but no longer needs refills at that pharmacy sine he is back in [place]. Patient however, would like for Dr. Daiva Eves to send new prescription for Chantix to [facility]. Patient states his chest is hurting due to smoking too much cigarettes. Patient does not have PCP to fill prescription. Will route message to Dr. Daiva Eves to see if it is okay to refill chantix. Lorenso Courier, [place]

## 2017-12-01 ENCOUNTER — Other Ambulatory Visit: Payer: Self-pay

## 2017-12-01 DIAGNOSIS — F17200 Nicotine dependence, unspecified, uncomplicated: Secondary | ICD-10-CM

## 2017-12-01 MED ORDER — VARENICLINE TARTRATE 0.5 MG X 11 & 1 MG X 42 PO MISC: ORAL | 0 refills | Status: DC

## 2017-12-01 NOTE — Telephone Encounter (Signed)
Chantix refll is completely fine

## 2017-12-23 ENCOUNTER — Other Ambulatory Visit: Payer: Self-pay

## 2018-01-06 ENCOUNTER — Ambulatory Visit (INDEPENDENT_AMBULATORY_CARE_PROVIDER_SITE_OTHER): Payer: Self-pay | Admitting: Infectious Disease

## 2018-01-06 ENCOUNTER — Encounter: Payer: Self-pay | Admitting: Infectious Disease

## 2018-01-06 VITALS — BP 142/97 | HR 84 | Temp 98.3°F | Wt 145.0 lb

## 2018-01-06 DIAGNOSIS — R61 Generalized hyperhidrosis: Secondary | ICD-10-CM

## 2018-01-06 DIAGNOSIS — N62 Hypertrophy of breast: Secondary | ICD-10-CM

## 2018-01-06 DIAGNOSIS — Z23 Encounter for immunization: Secondary | ICD-10-CM

## 2018-01-06 DIAGNOSIS — B20 Human immunodeficiency virus [HIV] disease: Secondary | ICD-10-CM

## 2018-01-06 DIAGNOSIS — F17200 Nicotine dependence, unspecified, uncomplicated: Secondary | ICD-10-CM

## 2018-01-06 HISTORY — DX: Hypertrophy of breast: N62

## 2018-01-06 MED ORDER — VARENICLINE TARTRATE 1 MG PO TABS: 1.0000 mg | ORAL_TABLET | Freq: Two times a day (BID) | ORAL | 1 refills | Status: DC

## 2018-01-06 MED ORDER — BICTEGRAVIR-EMTRICITAB-TENOFOV 50-200-25 MG PO TABS: 1.0000 | ORAL_TABLET | Freq: Every day | ORAL | 11 refills | Status: DC

## 2018-01-06 MED ORDER — VARENICLINE TARTRATE 0.5 MG X 11 & 1 MG X 42 PO MISC: ORAL | 0 refills | Status: DC

## 2018-01-06 NOTE — Addendum Note (Signed)
Addended by: Alesia MorinPOOLE, Dola Lunsford F on: 01/06/2018 12:13 PM   Modules accepted: Orders

## 2018-01-06 NOTE — Addendum Note (Signed)
Addended by: Alesia MorinPOOLE, TRAVIS F on: 01/06/2018 12:04 PM   Modules accepted: Orders

## 2018-01-06 NOTE — Progress Notes (Signed)
Chief complaint: Desire to quit smoking.   Subjective:    Patient ID: Bradley Hamilton, male    DOB: 1970-06-10, 47 y.o.   MRN: [id number]  HPI  47 year old [place] American man with HIV infection who moved from [place], [place] to [place] because famiily is here. He has had HIV infection x 16 years but never been on meds. His VL was only 500 and CD4 over 1k.  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   Placed him on BIKTARVY based on data from the ACT G study.  He has remained remained suppressed and adherent to his medications he is on the [facility] P program.  He has a case Production designer, theatre/television/film with [employer] helping him renew this and will do that next month.  He has started smoking again and asked if I could prescribe Chantix for this.  Several visits ago when I saw him he had been suffering from night sweats where his pillow is soaked in sweat as is his chest. He did not believe he has lost weight but family have told him they thin he has. He had cough as well but largely non productive.   On exam he had A very loud systolic murmur   I did blood cx which were NGTD x 7 days. He refused admission for TTE etc.   He is not having fevers but he is still having night sweats nearly every night.  I listened to his heart today the heart murmur was more subtle.  He does continue to have some dental caries that need attention I would like him to go to our dental clinic.  Also has unilateral gynecomastia   Past Medical History:  Diagnosis Date  . Cough 05/20/2016  . Heart murmur 05/20/2016  . HIV infection (HCC) 05/20/2016  . Night sweats 05/20/2016  . Smoker 05/20/2016    No past surgical history on file.  No family history on file.    Social History   Socioeconomic History  . Marital status: Single    Spouse name: Not on file  . Number of children: Not on file  . Years of education: Not on file  . Highest education  level: Not on file  Occupational History  . Not on file  Social Needs  . Financial resource strain: Not on file  . Food insecurity:    Worry: Not on file    Inability: Not on file  . Transportation needs:    Medical: Not on file    Non-medical: Not on file  Tobacco Use  . Smoking status: Current Every Day Smoker  . Smokeless tobacco: Never Used  Substance and Sexual Activity  . Alcohol use: Yes  . Drug use: No  . Sexual activity: Not Currently    Comment: declined condoms  Lifestyle  . Physical activity:    Days per week: Not on file    Minutes per session: Not on file  . Stress: Not on file  Relationships  . Social connections:    Talks on phone: Not on file    Gets together: Not on file    Attends religious service: Not on file    Active member of club or organization: Not on file    Attends meetings of clubs or organizations: Not on file    Relationship status: Not on file  Other Topics Concern  . Not on file  Social History Narrative  . Not on file    No Known Allergies   Current Outpatient Medications:  .  bictegravir-emtricitabine-tenofovir AF (BIKTARVY) 50-200-25 MG TABS tablet, Take 1 tablet by mouth daily., Disp: 30 tablet, Rfl: 5 .  FLUoxetine (PROZAC) 20 MG tablet, Take 20 mg by mouth daily., Disp: , Rfl:  .  risperidone (RISPERDAL) 4 MG tablet, Take 4 mg by mouth daily., Disp: , Rfl:  .  varenicline (CHANTIX STARTING MONTH PAK) 0.5 MG X 11 & 1 MG X 42 tablet, 0.5 mg qdaily x3 d, then0.5 mg bid xr 4 days, then1 mg bid (Patient not taking: Reported on 01/06/2018), Disp: 53 tablet, Rfl: 0   Review of Systems  Constitutional: Positive for diaphoresis. Negative for fever and unexpected weight change.  HENT: Negative for congestion and sore throat.   Eyes: Negative for photophobia.  Respiratory: Positive for cough. Negative for shortness of breath and wheezing.   Cardiovascular: Negative for chest pain, palpitations and leg swelling.  Gastrointestinal:  Negative for abdominal pain, blood in stool, constipation, diarrhea, nausea and vomiting.  Genitourinary: Negative for dysuria, flank pain and hematuria.  Musculoskeletal: Negative for back pain and myalgias.  Skin: Negative for rash.  Neurological: Negative for dizziness, weakness and headaches.  Hematological: Does not bruise/bleed easily.  Psychiatric/Behavioral: Negative for suicidal ideas.       Objective:   Physical Exam  Constitutional: He is oriented to person, place, and time. No distress.  HENT:  Head: Normocephalic and atraumatic.  Mouth/Throat: Oropharynx is clear and moist. Abnormal dentition. Dental caries present. No oropharyngeal exudate.  Eyes: Pupils are equal, round, and reactive to light. EOM are normal.  Neck: No tracheal deviation present. No thyromegaly present.  Cardiovascular: A regularly irregular rhythm present. Tachycardia present.  Murmur heard.  Systolic murmur is present with a grade of 4/6. Not a loud a murmur as before best heard LUSB  Pulmonary/Chest: Effort normal and breath sounds normal. No accessory muscle usage. No respiratory distress. He has no wheezes. He has no rales. He exhibits no tenderness.    Abdominal: Soft. Bowel sounds are normal. He exhibits no distension. There is no tenderness.  Musculoskeletal: Normal range of motion.  Lymphadenopathy:    He has no cervical adenopathy.  Neurological: He is alert and oriented to person, place, and time.  Skin: Skin is warm and dry. He is not diaphoretic.  Psychiatric: He has a normal mood and affect. His behavior is normal. Judgment and thought content normal.          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 There was data from our ACTG 5308 study that elite controllers benefit from ARV to reduce increased inflammatino they have compared to HIV + who are not elite controllers but who are on meds.   We will continue Biktarvy    Night sweat and   Murmur: For an echocardiogram but this is course expensive for him without insurance.  I will check a TSH today this was not checked before also check FSH LH prolactin  Gynecomastia he wonders if it might be due to medications Risperdal which might be possible again we will do hormone checks as above serum testosterone although it is later in the morning now.  Smoker: needs to quit, counselled and also rx chantix.  Starter pack with continuation pack again  C[place]: Refer to dental clinic

## 2018-01-07 LAB — T-HELPER CELL (CD4) - (RCID CLINIC ONLY)
CD4 % Helper T Cell: 29 % — ABNORMAL LOW (ref 33–55)
CD4 T CELL ABS: 200 /uL — AB (ref 400–2700)

## 2018-01-08 LAB — PROLACTIN: Prolactin: 19.6 ng/mL — ABNORMAL HIGH (ref 2.0–18.0)

## 2018-01-08 LAB — COMPLETE METABOLIC PANEL WITH GFR
AG Ratio: 1.4 (calc) (ref 1.0–2.5)
ALT: 45 U/L (ref 9–46)
AST: 143 U/L — ABNORMAL HIGH (ref 10–40)
Albumin: 4.7 g/dL (ref 3.6–5.1)
Alkaline phosphatase (APISO): 184 U/L — ABNORMAL HIGH (ref 40–115)
BUN: 13 mg/dL (ref 7–25)
CO2: 28 mmol/L (ref 20–32)
Calcium: 9.5 mg/dL (ref 8.6–10.3)
Chloride: 100 mmol/L (ref 98–110)
Creat: 0.92 mg/dL (ref 0.60–1.35)
GFR, Est African American: 115 mL/min/{1.73_m2} (ref >=60)
GFR, Est Non African American: 99 mL/min/{1.73_m2} (ref >=60)
Globulin: 3.3 g/dL (calc) (ref 1.9–3.7)
Glucose, Bld: 104 mg/dL — ABNORMAL HIGH (ref 65–99)
Potassium: 3.6 mmol/L (ref 3.5–5.3)
Sodium: 139 mmol/L (ref 135–146)
Total Bilirubin: 0.6 mg/dL (ref 0.2–1.2)
Total Protein: 8 g/dL (ref 6.1–8.1)

## 2018-01-08 LAB — LIPID PANEL
Cholesterol: 232 mg/dL — ABNORMAL HIGH (ref <200)
HDL: 80 mg/dL (ref >40)
LDL CHOLESTEROL (CALC): 124 mg/dL — AB
NON-HDL CHOLESTEROL (CALC): 152 mg/dL — AB (ref <130)
Total CHOL/HDL Ratio: 2.9 (calc) (ref <5.0)
Triglycerides: 165 mg/dL — ABNORMAL HIGH (ref <150)

## 2018-01-08 LAB — HIV-1 RNA QUANT-NO REFLEX-BLD
HIV 1 RNA Quant: <20 copies/mL
HIV-1 RNA Quant, Log: <1.3 Log copies/mL

## 2018-01-08 LAB — CBC WITH DIFFERENTIAL/PLATELET
Basophils Absolute: 0 cells/uL (ref 0–200)
Basophils Relative: 0 %
Eosinophils Absolute: 0 cells/uL — ABNORMAL LOW (ref 15–500)
Eosinophils Relative: 0 %
HEMATOCRIT: 36.1 % — AB (ref 38.5–50.0)
Hemoglobin: 12.8 g/dL — ABNORMAL LOW (ref 13.2–17.1)
Lymphs Abs: 688 cells/uL — ABNORMAL LOW (ref 850–3900)
MCH: 34.4 pg — ABNORMAL HIGH (ref 27.0–33.0)
MCHC: 35.5 g/dL (ref 32.0–36.0)
MCV: 97 fL (ref 80.0–100.0)
MPV: 9.8 fL (ref 7.5–12.5)
Monocytes Relative: 12.4 %
Neutro Abs: 1327 cells/uL — ABNORMAL LOW (ref 1500–7800)
Neutrophils Relative %: 57.7 %
Platelets: 149 10*3/uL (ref 140–400)
RBC: 3.72 10*6/uL — ABNORMAL LOW (ref 4.20–5.80)
RDW: 14.6 % (ref 11.0–15.0)
Total Lymphocyte: 29.9 %
WBC mixed population: 285 cells/uL (ref 200–950)
WBC: 2.3 10*3/uL — ABNORMAL LOW (ref 3.8–10.8)

## 2018-01-08 LAB — TESTOSTERONE TOTAL,FREE,BIO, MALES
Albumin: 4.7 g/dL (ref 3.6–5.1)
Sex Hormone Binding: 39 nmol/L (ref 10–50)
Testosterone, Bioavailable: 79.5 ng/dL — ABNORMAL LOW (ref >=110.0)
Testosterone, Free: 37.1 pg/mL — ABNORMAL LOW (ref 46.0–224.0)
Testosterone: 336 ng/dL (ref 250–827)

## 2018-01-08 LAB — RPR: RPR: NONREACTIVE

## 2018-01-08 LAB — LUTEINIZING HORMONE: LH: 12.1 m[IU]/mL — ABNORMAL HIGH (ref 1.5–9.3)

## 2018-01-08 LAB — FOLLICLE STIMULATING HORMONE: FSH: 10.2 m[IU]/mL — ABNORMAL HIGH (ref 1.6–8.0)

## 2018-01-08 LAB — TSH: TSH: 2.03 mIU/L (ref 0.40–4.50)

## 2018-01-16 ENCOUNTER — Telehealth: Payer: Self-pay

## 2018-01-16 NOTE — Telephone Encounter (Signed)
t

## 2018-01-16 NOTE — Telephone Encounter (Signed)
Left message for patient to call our office .   Laurell Josephsammy K King, RN

## 2018-01-16 NOTE — Telephone Encounter (Signed)
-----   Message from Randall Hissornelius N Van Dam, MD sent at 01/16/2018 10:08 AM EST ----- In someone call Bradley Hamilton and let him know that he was correct in his thoughts that his risperidone is likely causing his problems with his left breast and enlargement.  We checked his endocrine labs and his risperidone is causing really high prolactin levels which is messing with a lot of his other hormones including his testosterone which is been lowered.  I would prefer if he could come off this medication.  Can he go visit his psychiatrist or whoever is prescribing this and have it changed to a different medication?

## 2018-01-19 NOTE — Telephone Encounter (Signed)
Called Mr. Bradley Hamilton, Informed him per Dr Daiva EvesVan Dam that he too agrees Risperdone maybe causing problems with his left breast and enlargement.  Informed him his Prolactin levels were checked and were high and testosterone lowered also due to this.  Let him know Dr. Daiva EvesVan Dam suggested he come off of the medication and go follow up with his Psychiatrist so he can switch medications.  [facility] and he will call them to see if he can get his appointment moved up.  Patient verbalized understanding. Angeline SlimAshley Ruvim Risko RN

## 2018-01-19 NOTE — Telephone Encounter (Signed)
Thx so much Ashley!

## 2018-04-07 ENCOUNTER — Other Ambulatory Visit: Payer: Self-pay

## 2018-04-21 ENCOUNTER — Encounter: Payer: Self-pay | Admitting: Infectious Disease

## 2019-07-08 ENCOUNTER — Telehealth (HOSPITAL_COMMUNITY): Payer: Self-pay | Admitting: Psychiatry

## 2020-01-10 ENCOUNTER — Other Ambulatory Visit: Payer: Self-pay

## 2020-01-10 ENCOUNTER — Encounter (HOSPITAL_COMMUNITY): Payer: Self-pay | Admitting: Licensed Clinical Social Worker

## 2020-01-10 ENCOUNTER — Ambulatory Visit (INDEPENDENT_AMBULATORY_CARE_PROVIDER_SITE_OTHER): Payer: No Payment, Other | Admitting: Licensed Clinical Social Worker

## 2020-01-10 DIAGNOSIS — F29 Unspecified psychosis not due to a substance or known physiological condition: Secondary | ICD-10-CM

## 2020-01-10 DIAGNOSIS — F331 Major depressive disorder, recurrent, moderate: Secondary | ICD-10-CM

## 2020-01-10 NOTE — Progress Notes (Signed)
Comprehensive Clinical Assessment (CCA) Note  01/10/2020 Bradley Hamilton [id number]  Chief Complaint:  Chief Complaint  Patient presents with  . Depression  . Hallucinations    Hx of alchohol induced psychosis rapid thoughts pt states "I will express my thoughts just to bring them out"    Visit Diagnosis: MDD and psychosis    Client is a  49 year old male. Client is referred by [facility]  for a .   Client states mental health symptoms as evidenced by:   Depression: Fatigue; Difficulty Concentrating; Sleep (too little)   Mania: Change in energy/activity; Increased Energy; Racing thoughts  Anxiety: Tension, worry   psychosis: hallucination, paranoia   Client denies suicidal and homicidal ideations at this time.    Client was screened for the following SDOH: Smoking, financials social interactions   Assessment Information that integrates subjective and objective details with a therapist's professional interpretation:    LCSW and pt met for initial assessment/CCA. Bradley Hamilton was alert and oriented x 5. He was dressed casually and engaged well throughout assessment. Pt was restless throughout assessment as evidence by legs moving up and down. He presented with anxious mood/affect.   Primary stressor for pt is medication management and work. He states that he is currently attempting to get a job but has not been on his medications for Risperdal or fluoxetine. He was currently with [facility] and states that he was prescribed these medications but did not know they had stopped seeing uninsured people. Bradley Hamilton uses [employer]. 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   Per pt he is drinking about 6 to 8 beers (3-4 16 ounce) on the weekend and has cut down on his drinking in the past year. He admits to hearing his thoughts and responding to them out loud especially when he is alone even when he is not using alcohol. Bradley Hamilton also reports paranoia  when leaving the house and thinks people are following him. Pt did not want to schedule f/u therapy appointment until he figured out if he was going to get the job he is going after. He states he will call back when he knows more. Pt was presented for walk-in times for medication management for tomorrow   from 8-11.     Client meets criteria fo:  MDD and psychosis   Client states use of the following substances: Alcohol   Therapist addressed (substance use) concern, although client meets criteria, he/ she reports they do not wish to pursue tx at this time although therapist feels they would benefit from [place] counseling. (IF CLIENT HAS A S/A PROBLEM)   Treatment recommendations are include plan: Pt wants CCA completed for medication mgmt.      Client was in agreement with treatment recommendations.  CCA Screening, Triage and Referral (STR)  Patient Reported Information Referral name: Bradley Hamilton   Whom do you see for routine medical problems? I don't have a doctor  What Do You Feel Would Help You the Most Today? Therapy;Medication   Have You Recently Been in Any Inpatient Treatment (Hospital/Detox/Crisis Center/28-Day Program)? No  Have You Ever Received Services From [employer] Before? No  Have You Recently Had Any Thoughts About Hurting Yourself? No  Are You Planning to Commit Suicide/Harm Yourself At This time? No   Have you Recently Had Thoughts About [place]? No  Have You Used Any Alcohol or Drugs in the Past 24 Hours? No  Do You Currently Have a  Therapist/Psychiatrist? No   Have You Been Recently Discharged From Any Office Practice or Programs? No    CCA Screening Triage Referral Assessment Type of Contact: Face-to-Face  Patient Reported Information Reviewed? Yes  Patient Left Without Being Seen? No  Collateral Involvement: No data recorded  Is CPS involved or ever been involved? Never  Is APS involved or ever been involved? Never   Patient  Determined To Be At Risk for Harm To Self or Others Based on Review of Patient Reported Information or Presenting Complaint? No   Location of Assessment: [facility] [place] of Residence: [place]     CCA Biopsychosocial Intake/Chief Complaint:  Medication needs for risperdal and fluoxetine  Current Symptoms/Problems: rapid thought, decrease sleep "insomnia waking up multiple times per night", restless, fearful, parnoia always feel like someone is watching,   Patient Reported Schizophrenia/Schizoaffective Diagnosis in Past: Yes   Strengths: social, Pharmacist, hospital,  Abilities: saxaphone   Type of Services Patient Feels are Needed: therapy and medication mgmt   Initial Clinical Notes/Concerns: No data recorded  Mental Health Symptoms Depression:  Fatigue;Difficulty Concentrating;Sleep (too much or little)   Duration of Depressive symptoms: Greater than two weeks   Mania:  Change in energy/activity;Increased Energy;Racing thoughts   Anxiety:   Worrying;Tension   Psychosis:  Hallucinations (Pt answers his though outloud to encourage himself to encourage himself that everything is going to be okay)   Duration of Psychotic symptoms: Greater than six months   Trauma:  No data recorded  Obsessions:  N/A   Compulsions:  N/A   Inattention:  N/A   Hyperactivity/Impulsivity:  N/A   Oppositional/Defiant Behaviors:  N/A   Emotional Irregularity:  N/A   Other Mood/Personality Symptoms:  No data recorded   Mental Status Exam Appearance and self-care  Stature:  Average   Weight:  Average weight   Clothing:  Casual   Grooming:  Normal   Cosmetic use:  None   Posture/gait:  Normal   Motor activity:  Restless   Sensorium  Attention:  No data recorded  Concentration:  Normal   Orientation:  X5   Recall/memory:  Normal   Affect and Mood  Affect:  Anxious   Mood:  Anxious   Relating  Eye contact:  Normal   Facial expression:  Anxious    Attitude toward examiner:  Cooperative   Thought and Language  Speech flow: Clear and Coherent   Thought content:  Appropriate to Mood and Circumstances   Preoccupation:  No data recorded  Hallucinations:  No data recorded  Organization:  No data recorded  [employer] of Knowledge:  Fair   Intelligence:  Average   Abstraction:  Functional   Judgement:  Fair   Art therapist:  Realistic   Insight:  No data recorded  Decision Making:  No data recorded  Social Functioning  Social Maturity:  No data recorded  Social Judgement:  No data recorded  Stress  Stressors:  Work   Coping Ability:  Advice worker Deficits:  No data recorded  Supports:  Family     Religion: Religion/Spirituality Are You A Religious Person?: Yes What is Your Religious Affiliation?: Other (islam)  Leisure/Recreation: Leisure / Recreation Do You Have Hobbies?: Yes Leisure and Hobbies: saxaphone  Exercise/Diet: Exercise/Diet Do You Exercise?: Yes What Type of Exercise Do You Do?: Run/Walk How Many Times a Week Do You Exercise?: 1-3 times a week Have You Gained or Lost A Significant Amount of Weight in the  Past Six Months?: No Do You Follow a Special Diet?: No Do You Have Any Trouble Sleeping?: Yes Explanation of Sleeping Difficulties: trouble staying asleep   CCA Employment/Education Employment/Work Situation: Employment / Work Situation Employment situation: Unemployed Patient's job has been impacted by current illness: No What is the longest time patient has a held a job?: 5 years 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 Has patient ever been in the [employer]?: No  Education: Education Is Patient Currently Attending School?: No Last Grade Completed: 12 Did Teacher, adult education From [employer]?: Yes Did Physicist, medical?: Yes What Type of College Degree Do you Have?: masters education Did Heritage manager?: Yes Did You Have An  Individualized Education Program (IIEP): No Did You Have Any Difficulty At [employer]?: No Patient's Education Has Been Impacted by Current Illness: No   CCA Family/Childhood History Family and Relationship History: Family history Marital status: Single Are you sexually active?: No Does patient have children?: No  Childhood History:  Childhood History By whom was/is the patient raised?: Both parents Description of patient's relationship with caregiver when they were a child: good Patient's description of current relationship with people who raised him/her: great Does patient have siblings?: No Did patient suffer any verbal/emotional/physical/sexual abuse as a child?: No Did patient suffer from severe childhood neglect?: No Has patient ever been sexually abused/assaulted/raped as an adolescent or adult?: No Was the patient ever a victim of a crime or a disaster?: No Witnessed domestic violence?: No Has patient been affected by domestic violence as an adult?: No  Child/Adolescent Assessment:     CCA Substance Use Alcohol/Drug Use: Alcohol / Drug Use History of alcohol / drug use?: Yes    ASAM's:  Six Dimensions of Multidimensional Assessment  Dimension 1:  Acute Intoxication and/or Withdrawal Potential:   Dimension 1:  Description of individual's past and current experiences of substance use and withdrawal: alchohol Hx of abuse currently drinks 3 tall boys in 1 weekend.  Dimension 2:  Biomedical Conditions and Complications:      Dimension 3:  Emotional, Behavioral, or Cognitive Conditions and Complications:     Dimension 4:  Readiness to Change:     Dimension 5:  Relapse, Continued use, or Continued Problem Potential:     Dimension 6:  Recovery/Living Environment:     ASAM Severity Score:    ASAM Recommended Level of Treatment:      DSM5 Diagnoses: Patient Active Problem List   Diagnosis Date Noted  . Gynecomastia 01/06/2018  . Elevated blood pressure reading  09/02/2017  . HIV disease ([place]) 05/20/2016  . Heart murmur 05/20/2016  . Smoker 05/20/2016  . Night sweats 05/20/2016  . Cough 05/20/2016  . ALLERGIC RHINITIS 11/17/2006        Dory Horn, LCSW

## 2020-01-11 ENCOUNTER — Ambulatory Visit (INDEPENDENT_AMBULATORY_CARE_PROVIDER_SITE_OTHER): Payer: No Payment, Other | Admitting: Physician Assistant

## 2020-01-11 ENCOUNTER — Encounter (HOSPITAL_COMMUNITY): Payer: Self-pay | Admitting: Physician Assistant

## 2020-01-11 VITALS — BP 134/99 | HR 89 | Ht 74.0 in | Wt 131.5 lb

## 2020-01-11 DIAGNOSIS — F29 Unspecified psychosis not due to a substance or known physiological condition: Secondary | ICD-10-CM | POA: Diagnosis not present

## 2020-01-11 DIAGNOSIS — F331 Major depressive disorder, recurrent, moderate: Secondary | ICD-10-CM

## 2020-01-11 MED ORDER — FLUOXETINE HCL 20 MG PO TABS: 20.0000 mg | ORAL_TABLET | Freq: Every day | ORAL | 2 refills | Status: DC

## 2020-01-11 MED ORDER — RISPERIDONE 4 MG PO TABS: 4.0000 mg | ORAL_TABLET | Freq: Every day | ORAL | 2 refills | Status: DC

## 2020-01-11 NOTE — Progress Notes (Signed)
Psychiatric Initial Adult Assessment   Patient Identification: Bradley Hamilton MRN:  [id number] Date of Evaluation:  01/11/2020 Referral Source: Evaluation by LCSW Chief Complaint:   Chief Complaint    WALK-IN; Medication Management     Visit Diagnosis: No diagnosis found.  History of Present Illness:  Bradley Hamilton is a 49 year old male with a past psychiatric history significant for unspecified psychosis who presents to [facility] for medication management.  Patient states that he was set up with [facility] for roughly 15 years for his psychiatric needs.  While being seen at [facility], patient states that he was originally being treated for schizophrenia.  Patient endorses the following symptoms: racing thoughts, restlessness, and difficulty with concentration.  Patient also believes that sometimes people are watching him through his timeline on Facebook all the time.  Patient endorses anxiety that he rates it 3 out of 10. Patient has been off his medications for a month and is requesting that he be placed back on his medications.  Patient reports that he has been on the following medications for his psychiatric needs:  Risperidone 4 mg at bedtime Fluoxetine 20 mg 1 time daily  Patient reports that he has had no issues or concerns with this medication regimen.  Patient denies suicidal and homicidal ideations.  He further denies auditory or visual hallucinations.  Patient endorses fair sleep and receives on average 7 hours of intermittent sleep.  Patient endorses that he feels well rested upon waking.  He endorses appetite and eats on average 3 meals per day.  Patient endorses alcohol use and states that he drinks two 16 ounce cans of beers 3 times per week.  He endorses tobacco use and states that he smokes a pack per day.  Patient denies illicit drug use and states he has no past history of drug use.  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 Patient reports that he has experienced repeated events that point to them. Paitent reports that the reason they are targeting him is because he spoke out towards their practices and behaviors Had a traumatic exposure in the last month:  N/A Re-experiencing:  Flashbacks Nightmares Hypervigilance:  Yes Hyperarousal:  Irritability/Anger Avoidance:  None  Past Psychiatric History: Patient is unsure of his diagnosis  Previous Psychotropic Medications: Yes   Substance Abuse History in the last 12 months:  No.  Consequences of Substance Abuse: NA  Past Medical History:  Past Medical History:  Diagnosis Date  . Cough 05/20/2016  . Gynecomastia 01/06/2018  . Heart murmur 05/20/2016  . HIV infection (HCC) 05/20/2016  . Night sweats 05/20/2016  . Smoker 05/20/2016   No past surgical history on file.  Family Psychiatric History: None  Family History: No family history on file.  Social History:   Social History   Socioeconomic History  . Marital status: Single    Spouse name: Not on file  . Number of children: Not on file  . Years of education: Not on file  . Highest education level: Not on file  Occupational History  . Not on file  Tobacco Use  . Smoking status: Current Every  Day Smoker    Packs/day: 1.00    Types: Cigarettes  . Smokeless tobacco: Never Used  Vaping Use  . Vaping Use: Never used  Substance and Sexual Activity  . Alcohol use: Yes    Alcohol/week: 3.0 standard drinks    Types: 3 Cans of beer per  week  . Drug use: No  . Sexual activity: Not Currently    Comment: declined condoms  Other Topics Concern  . Not on file  Social History Narrative  . Not on file   Social Determinants of Health   Financial Resource Strain: High Risk  . Difficulty of Paying Living Expenses: Very hard  Food Insecurity: No Food Insecurity  . Worried About Programme researcher, broadcasting/film/video in the Last Year: Never true  . Ran Out of Food in the Last Year: Never true  Transportation Needs: No Transportation Needs  . Lack of Transportation (Medical): No  . Lack of Transportation (Non-Medical): No  Physical Activity: Sufficiently Active  . Days of Exercise per Week: 4 days  . Minutes of Exercise per Session: 60 min  Stress:   . Feeling of Stress : Not on file  Social Connections: Socially Isolated  . Frequency of Communication with Friends and Family: Never  . Frequency of Social Gatherings with Friends and Family: Never  . Attends Religious Services: Never  . Active Member of Clubs or Organizations: No  . Attends Banker Meetings: Never  . Marital Status: Never married    Additional Social History:  Allergies:  No Known Allergies  Metabolic Disorder Labs: No results found for: HGBA1C, MPG Lab Results  Component Value Date   PROLACTIN 19.6 (H) 01/06/2018   Lab Results  Component Value Date   CHOL 232 (H) 01/06/2018   TRIG 165 (H) 01/06/2018   HDL 80 01/06/2018   CHOLHDL 2.9 01/06/2018   VLDL NOT CALC 05/09/2016   LDLCALC 124 (H) 01/06/2018   LDLCALC 111 (H) 08/27/2017   Lab Results  Component Value Date   TSH 2.03 01/06/2018    Therapeutic Level Labs: No results found for: LITHIUM No results found for: CBMZ No results found for: VALPROATE  Current Medications: Current Outpatient Medications  Medication Sig Dispense Refill  . bictegravir-emtricitabine-tenofovir AF (BIKTARVY) 50-200-25 MG TABS tablet Take 1 tablet by mouth daily. 30 tablet 11  . FLUoxetine (PROZAC) 20 MG tablet  Take 20 mg by mouth daily.    . risperidone (RISPERDAL) 4 MG tablet Take 4 mg by mouth daily.     No current facility-administered medications for this visit.    Musculoskeletal: Strength & Muscle Tone: within normal limits Gait & Station: normal Patient leans: N/A  Psychiatric Specialty Exam: Review of Systems  Psychiatric/Behavioral: Positive for decreased concentration and sleep disturbance. Negative for hallucinations and suicidal ideas. The patient is not nervous/anxious.     Blood pressure (!) 134/99, pulse 89, height 6\' 2"  (1.88 m), weight 131 lb 8 oz (59.6 kg), SpO2 100 %.Body mass index is 16.88 kg/m.  General Appearance: Well Groomed  Eye Contact:  Good  Speech:  Clear and Coherent and Normal Rate  Volume:  Normal  Mood:  Euthymic  Affect:  Appropriate and Congruent  Thought Process:  Coherent, Goal Directed and Descriptions of Associations: Intact  Orientation:  Full (Time, Place, and Person)  Thought Content:  WDL and Logical  Suicidal Thoughts:  No  Homicidal Thoughts:  No  Memory:  Immediate;   Good Recent;   Good Remote;   Good  Judgement:  Good  Insight:  Fair  Psychomotor Activity:  Normal  Concentration:  Concentration: Good and Attention Span: Good  Recall:  Good  Fund of Knowledge:Good  Language: Good  Akathisia:  NA  Handed:  Right  AIMS (if indicated):  not done  Assets:  Communication Skills Desire for Improvement Housing Resilience  ADL's:  Intact  Cognition: WNL  Sleep:  Fair   Screenings: PHQ2-9     Counselor from 01/10/2020 in [facility] Office Visit from 01/06/2018 in [facility] Office Visit from 09/02/2017 in [facility] Office Visit from 10/14/2016 in [facility] for Infectious Disease Office Visit from 05/20/2016 in [facility] for Infectious Disease  PHQ-2 Total Score 0 0 0 0 0      Assessment and  Plan:  Bradley Hamilton is a 49 year old male with a past psychiatric history significant for unspecified psychosis who presents to [facility] for medication management.  Patient reports that he was placed on fluoxetine 20 mg and risperidone 4 mg at bedtime while he was being seen at [facility] several years ago.  Patient would like to be placed back on his past medications and has no other issues or concerns at this time.  Patient's medications will be e-prescribed to his pharmacy of choice.  1. Major depressive disorder, recurrent episode, moderate (HCC)  - FLUoxetine (PROZAC) 20 MG tablet; Take 1 tablet (20 mg total) by mouth daily.  Dispense: 30 tablet; Refill: 2  2. Psychosis, unspecified psychosis type (HCC)  - risperidone (RISPERDAL) 4 MG tablet; Take 1 tablet (4 mg total) by mouth at bedtime.  Dispense: 30 tablet; Refill: 2  Patient to follow up in 4 weeks  Meta Hatchet, PA 12/7/202110:09 AM

## 2020-01-27 ENCOUNTER — Ambulatory Visit (HOSPITAL_COMMUNITY): Payer: No Payment, Other | Admitting: Licensed Clinical Social Worker

## 2020-01-27 ENCOUNTER — Telehealth (HOSPITAL_COMMUNITY): Payer: Self-pay | Admitting: Licensed Clinical Social Worker

## 2020-01-27 ENCOUNTER — Other Ambulatory Visit: Payer: Self-pay

## 2020-01-27 ENCOUNTER — Telehealth (HOSPITAL_COMMUNITY): Payer: No Payment, Other | Admitting: Physician Assistant

## 2020-01-27 NOTE — Telephone Encounter (Signed)
2 links were sent to email address provided. Pt home phone was called with no answer after pt did not reply to emailed links. No answer on home phone, HIPAA compliant VM left for pt to reschedule.

## 2020-03-05 ENCOUNTER — Encounter (HOSPITAL_COMMUNITY): Payer: Self-pay | Admitting: Physician Assistant

## 2020-03-17 ENCOUNTER — Ambulatory Visit (HOSPITAL_COMMUNITY): Payer: No Payment, Other | Admitting: Licensed Clinical Social Worker

## 2020-03-17 ENCOUNTER — Telehealth (HOSPITAL_COMMUNITY): Payer: Self-pay | Admitting: Licensed Clinical Social Worker

## 2020-03-17 ENCOUNTER — Other Ambulatory Visit: Payer: Self-pay

## 2020-03-17 NOTE — Telephone Encounter (Signed)
LCSW sent two links to pt phone with no response. After no response LCSW f/u with PC. LCSW left HIPAA compliant VM. This is pt 2nd no show in a row.

## 2020-03-17 NOTE — Telephone Encounter (Signed)
LCSW sent two links to pt phone with no response. LCSW f/u with PC with no answer. HIPAA compliant VM left for pt. This is pt 2nd no show in a row. Pt will be offered walk-in appt only.

## 2020-05-23 ENCOUNTER — Telehealth (HOSPITAL_COMMUNITY): Payer: No Payment, Other | Admitting: Physician Assistant

## 2020-05-30 ENCOUNTER — Telehealth (HOSPITAL_COMMUNITY): Payer: No Payment, Other | Admitting: Physician Assistant

## 2020-06-07 ENCOUNTER — Telehealth (HOSPITAL_COMMUNITY): Payer: Self-pay | Admitting: Physician Assistant

## 2020-06-07 NOTE — Telephone Encounter (Signed)
Patient contacted the office to reschedule for an appointment missed 4/26. Writer informed patient of walk-in hours for his provider and gave the patient the next available appt on 5/20. Patient was understanding and requested that if there was a cancellation that he be contacted to be added in.

## 2020-06-23 ENCOUNTER — Telehealth (INDEPENDENT_AMBULATORY_CARE_PROVIDER_SITE_OTHER): Payer: No Payment, Other | Admitting: Physician Assistant

## 2020-06-23 ENCOUNTER — Encounter (HOSPITAL_COMMUNITY): Payer: Self-pay | Admitting: Physician Assistant

## 2020-06-23 ENCOUNTER — Other Ambulatory Visit: Payer: Self-pay

## 2020-06-23 DIAGNOSIS — F331 Major depressive disorder, recurrent, moderate: Secondary | ICD-10-CM | POA: Diagnosis not present

## 2020-06-23 DIAGNOSIS — F29 Unspecified psychosis not due to a substance or known physiological condition: Secondary | ICD-10-CM | POA: Diagnosis not present

## 2020-06-23 MED ORDER — FLUOXETINE HCL 20 MG PO TABS: 20.0000 mg | ORAL_TABLET | Freq: Every day | ORAL | 1 refills | Status: DC

## 2020-06-23 MED ORDER — RISPERIDONE 4 MG PO TABS: 4.0000 mg | ORAL_TABLET | Freq: Every day | ORAL | 1 refills | Status: DC

## 2020-06-23 NOTE — Progress Notes (Signed)
BH MD/PA/NP OP Progress Note  Virtual Visit via Telephone Note  I connected with Bradley Hamilton on 06/23/20 at 10:00 AM EDT by telephone and verified that I am speaking with the correct person using two identifiers.  Location: Patient: Home Provider: Clinic   I discussed the limitations, risks, security and privacy concerns of performing an evaluation and management service by telephone and the availability of in person appointments. I also discussed with the patient that there may be a patient responsible charge related to this service. The patient expressed understanding and agreed to proceed.  Follow Up Instructions:   I discussed the assessment and treatment plan with the patient. The patient was provided an opportunity to ask questions and all were answered. The patient agreed with the plan and demonstrated an understanding of the instructions.   The patient was advised to call back or seek an in-person evaluation if the symptoms worsen or if the condition fails to improve as anticipated.  I provided 15 minutes of non-face-to-face time during this encounter.  Meta Hatchet, PA   06/23/2020 10:48 PM Bradley Hamilton  MRN:  [id number]  Chief Complaint: Follow up and medication management  HPI:   Bradley Hamilton is a 50 year old male with a past psychiatric history significant for major depressive disorder and unspecified psychosis who presents to [facility] via virtual telephone visit for follow-up and medication management.  Patient is currently being managed on the following medications:  Risperidone 4 mg at bedtime Fluoxetine 20 mg daily  Patient reports no issues or concerns regarding his current medication regimen and states that his medications have been working excellently.  Patient denies a need for dosage adjustments at this time and is requesting refills on both his medications following the conclusion of the encounter.   Patient denies any new stressors or life changing events.  Patient reports that he is still unemployed but he is still trying to take the steps necessary to be employed.  Patient reports that his computer recently crashed and would like to have his virtual encounters be via telephone.  A GAD-7 screen was performed with the patient scoring a 7.  Patient is pleasant, calm, cooperative, and fully engaged in conversation during the encounter.  Patient reports that he feels good and is happy to finally have a follow-up encounter take place.  Patient denies suicidal or homicidal ideations.  He further denies auditory or visual hallucinations and does not appear to be responding to internal/external stimuli.  Patient endorses good sleep and receives on average 8 hours of sleep each day.  Patient endorses good appetite and eats on average 2 meals per day.  Patient endorses alcohol consumption and states that he drinks red wine occasionally on the weekends.  Patient endorses tobacco use and smokes roughly 1/2 pack/day.  Patient denies current illicit drug use.  Visit Diagnosis:    ICD-10-CM   1. Major depressive disorder, recurrent episode, moderate (HCC)  F33.1 FLUoxetine (PROZAC) 20 MG tablet  2. Psychosis, unspecified psychosis type (HCC)  F29 risperidone (RISPERDAL) 4 MG tablet    Past Psychiatric History: Major depressive disorder Unspecified psychosis  Past Medical History:  Past Medical History:  Diagnosis Date  . Cough 05/20/2016  . Gynecomastia 01/06/2018  . Heart murmur 05/20/2016  . HIV infection (HCC) 05/20/2016  . Night sweats 05/20/2016  . Smoker 05/20/2016   History reviewed. No pertinent surgical history.  Family Psychiatric History: None  Family History: History reviewed. No pertinent  family history.  Social History:  Social History   Socioeconomic History  . Marital status: Single    Spouse name: Not on file  . Number of children: Not on file  . Years of education: Not on file   . Highest education level: Not on file  Occupational History  . Not on file  Tobacco Use  . Smoking status: Current Every Day Smoker    Packs/day: 1.00    Types: Cigarettes  . Smokeless tobacco: Never Used  Vaping Use  . Vaping Use: Never used  Substance and Sexual Activity  . Alcohol use: Yes    Alcohol/week: 3.0 standard drinks    Types: 3 Cans of beer per week  . Drug use: No  . Sexual activity: Not Currently    Comment: declined condoms  Other Topics Concern  . Not on file  Social History Narrative  . Not on file   Social Determinants of Health   Financial Resource Strain: High Risk  . Difficulty of Paying Living Expenses: Very hard  Food Insecurity: No Food Insecurity  . Worried About Programme researcher, broadcasting/film/video in the Last Year: Never true  . Ran Out of Food in the Last Year: Never true  Transportation Needs: No Transportation Needs  . Lack of Transportation (Medical): No  . Lack of Transportation (Non-Medical): No  Physical Activity: Sufficiently Active  . Days of Exercise per Week: 4 days  . Minutes of Exercise per Session: 60 min  Stress: Not on file  Social Connections: Socially Isolated  . Frequency of Communication with Friends and Family: Never  . Frequency of Social Gatherings with Friends and Family: Never  . Attends Religious Services: Never  . Active Member of Clubs or Organizations: No  . Attends Banker Meetings: Never  . Marital Status: Never married    Allergies: No Known Allergies  Metabolic Disorder Labs: No results found for: HGBA1C, MPG Lab Results  Component Value Date   PROLACTIN 19.6 (H) 01/06/2018   Lab Results  Component Value Date   CHOL 232 (H) 01/06/2018   TRIG 165 (H) 01/06/2018   HDL 80 01/06/2018   CHOLHDL 2.9 01/06/2018   VLDL NOT CALC 05/09/2016   LDLCALC 124 (H) 01/06/2018   LDLCALC 111 (H) 08/27/2017   Lab Results  Component Value Date   TSH 2.03 01/06/2018    Therapeutic Level Labs: No results found  for: LITHIUM No results found for: VALPROATE No components found for:  CBMZ  Current Medications: Current Outpatient Medications  Medication Sig Dispense Refill  . bictegravir-emtricitabine-tenofovir AF (BIKTARVY) 50-200-25 MG TABS tablet Take 1 tablet by mouth daily. 30 tablet 11  . FLUoxetine (PROZAC) 20 MG tablet Take 1 tablet (20 mg total) by mouth daily. 30 tablet 1  . risperidone (RISPERDAL) 4 MG tablet Take 1 tablet (4 mg total) by mouth at bedtime. 30 tablet 1   No current facility-administered medications for this visit.     Musculoskeletal: Strength & Muscle Tone: Unable to assess due to telemedicine visit Gait & Station: Unable to assess due to telemedicine visit Patient leans: Unable to assess due to telemedicine visit  Psychiatric Specialty Exam: Review of Systems  Psychiatric/Behavioral: Negative for decreased concentration, dysphoric mood, hallucinations, self-injury, sleep disturbance and suicidal ideas. The patient is not nervous/anxious and is not hyperactive.     There were no vitals taken for this visit.There is no height or weight on file to calculate BMI.  General Appearance: Unable to assess due to telemedicine  visit  Eye Contact:  Unable to assess due to telemedicine visit  Speech:  Clear and Coherent and Normal Rate  Volume:  Normal  Mood:  Euthymic  Affect:  Appropriate  Thought Process:  Coherent, Goal Directed and Descriptions of Associations: Intact  Orientation:  Full (Time, Place, and Person)  Thought Content: WDL   Suicidal Thoughts:  No  Homicidal Thoughts:  No  Memory:  Immediate;   Good Recent;   Good Remote;   Good  Judgement:  Good  Insight:  Fair  Psychomotor Activity:  Normal  Concentration:  Concentration: Good and Attention Span: Good  Recall:  Good  Fund of Knowledge: Good  Language: Good  Akathisia:  NA  Handed:  Right  AIMS (if indicated): not done  Assets:  Communication Skills Desire for Improvement Housing Resilience   ADL's:  Intact  Cognition: WNL  Sleep:  Good   Screenings: GAD-7   Flowsheet Row Video Visit from 06/23/2020 in [facility]  Total GAD-7 Score 7    PHQ2-9   Flowsheet Row Video Visit from 06/23/2020 in [facility] Counselor from 01/10/2020 in [facility] Office Visit from 01/06/2018 in [facility] for Infectious Disease Office Visit from 09/02/2017 in [facility] for Infectious Disease Office Visit from 10/14/2016 in [facility] for Infectious Disease  PHQ-2 Total Score 0 0 0 0 0    Flowsheet Row Video Visit from 06/23/2020 in [facility]  C-SSRS RISK CATEGORY Low Risk       Assessment and Plan:   Bradley Hamilton is a 50 year old male with a past psychiatric history significant for major depressive disorder and unspecified psychosis who presents to [facility] via virtual telephone visit for follow-up and medication management.  Patient reports no issues or concerns regarding his current medication regimen.  Patient denies a need for dosage adjustments at this time and is requesting refills on both his medications.  Patient's medications to be e-prescribed to pharmacy of choice.  1. Major depressive disorder, recurrent episode, moderate (HCC)  - FLUoxetine (PROZAC) 20 MG tablet; Take 1 tablet (20 mg total) by mouth daily.  Dispense: 30 tablet; Refill: 1  2. Psychosis, unspecified psychosis type (HCC)  - risperidone (RISPERDAL) 4 MG tablet; Take 1 tablet (4 mg total) by mouth at bedtime.  Dispense: 30 tablet; Refill: 1  Patient to follow up in 2 months  Meta Hatchet, PA 06/23/2020, 10:48 PM

## 2020-08-25 ENCOUNTER — Telehealth (HOSPITAL_COMMUNITY): Payer: No Payment, Other | Admitting: Physician Assistant

## 2020-08-25 ENCOUNTER — Other Ambulatory Visit: Payer: Self-pay

## 2020-10-05 ENCOUNTER — Telehealth (HOSPITAL_COMMUNITY): Payer: No Payment, Other | Admitting: Physician Assistant

## 2020-10-05 ENCOUNTER — Other Ambulatory Visit (HOSPITAL_COMMUNITY): Payer: Self-pay | Admitting: Physician Assistant

## 2020-10-05 DIAGNOSIS — F29 Unspecified psychosis not due to a substance or known physiological condition: Secondary | ICD-10-CM

## 2020-10-18 ENCOUNTER — Telehealth (HOSPITAL_COMMUNITY): Payer: No Payment, Other | Admitting: Physician Assistant

## 2020-10-18 ENCOUNTER — Other Ambulatory Visit: Payer: Self-pay

## 2020-10-26 ENCOUNTER — Other Ambulatory Visit: Payer: Self-pay

## 2020-10-26 ENCOUNTER — Ambulatory Visit (INDEPENDENT_AMBULATORY_CARE_PROVIDER_SITE_OTHER): Payer: No Payment, Other | Admitting: Physician Assistant

## 2020-10-26 DIAGNOSIS — F331 Major depressive disorder, recurrent, moderate: Secondary | ICD-10-CM

## 2020-10-26 DIAGNOSIS — F29 Unspecified psychosis not due to a substance or known physiological condition: Secondary | ICD-10-CM | POA: Diagnosis not present

## 2020-10-26 MED ORDER — FLUOXETINE HCL 20 MG PO TABS: 20.0000 mg | ORAL_TABLET | Freq: Every day | ORAL | 2 refills | Status: DC

## 2020-10-26 MED ORDER — RISPERIDONE 4 MG PO TABS: 4.0000 mg | ORAL_TABLET | Freq: Every day | ORAL | 2 refills | Status: DC

## 2020-10-26 NOTE — Progress Notes (Addendum)
BH MD/PA/NP OP Progress Note  11/03/2020 1:31 AM Bradley Hamilton  MRN:  [id number]  Chief Complaint:  Chief Complaint   Stress    HPI:   Bradley Hamilton is a 50 year old male with a past psychiatric history significant for psychosis and major depressive disorder who presents to [facility] for follow-up and medication management.  Patient is currently being managed on the following medications:  Fluoxetine 20 mg daily Risperidone 4 mg at bedtime  Patient reports no issues or concerns regarding his current medication regimen.  Patient denies the need for dosage adjustments at this time and is requesting refills on all of his medications following the conclusion of the encounter.  Patient states that he continues to take his medications as prescribed.  Patient denies experiencing any depressive symptoms nor does he report any anxiety.  Patient denies any new stressors at this time and further denies any other issues regarding his mental health.  A GAD-7 screen was performed with the patient scoring a 2.  Patient is alert and oriented x4, calm, cooperative, and fully engaged in conversation during the encounter.  Patient endorses good mood.  Patient denies suicidal or homicidal ideations.  He further denies auditory or visual hallucinations and does not appear to be responding to internal/external stimuli.  Patient endorses good sleep and receives on average 8 hours of sleep per night.  Patient endorses good appetite and eats on average 2 meals per day.  Patient endorses alcohol consumption sparingly.  Patient endorses tobacco use and smokes on average a pack per day.  Patient denies illicit drug use.  Visit Diagnosis:    ICD-10-CM   1. Psychosis, unspecified psychosis type (HCC)  F29 risperidone (RISPERDAL) 4 MG tablet    2. Major depressive disorder, recurrent episode, moderate (HCC)  F33.1 FLUoxetine (PROZAC) 20 MG tablet      Past Psychiatric  History:  Major depressive disorder Unspecified psychosis  Past Medical History:  Past Medical History:  Diagnosis Date   Cough 05/20/2016   Gynecomastia 01/06/2018   Heart murmur 05/20/2016   HIV infection (HCC) 05/20/2016   Night sweats 05/20/2016   Smoker 05/20/2016   No past surgical history on file.  Family Psychiatric History:  None  Family History: No family history on file.  Social History:  Social History   Socioeconomic History   Marital status: Single    Spouse name: Not on file   Number of children: Not on file   Years of education: Not on file   Highest education level: Not on file  Occupational History   Not on file  Tobacco Use   Smoking status: Every Day    Packs/day: 1.00    Types: Cigarettes   Smokeless tobacco: Never  Vaping Use   Vaping Use: Never used  Substance and Sexual Activity   Alcohol use: Yes    Alcohol/week: 3.0 standard drinks    Types: 3 Cans of beer per week   Drug use: No   Sexual activity: Not Currently    Comment: declined condoms  Other Topics Concern   Not on file  Social History Narrative   Not on file   Social Determinants of Health   Financial Resource Strain: High Risk   Difficulty of Paying Living Expenses: Very hard  Food Insecurity: No Food Insecurity   Worried About Programme researcher, broadcasting/film/video in the Last Year: Never true   Ran Out of Food in the Last Year: Never true  Transportation  Needs: No Transportation Needs   Lack of Transportation (Medical): No   Lack of Transportation (Non-Medical): No  Physical Activity: Sufficiently Active   Days of Exercise per Week: 4 days   Minutes of Exercise per Session: 60 min  Stress: Not on file  Social Connections: Socially Isolated   Frequency of Communication with Friends and Family: Never   Frequency of Social Gatherings with Friends and Family: Never   Attends Religious Services: Never   Database administrator or Organizations: No   Attends Engineer, structural: Never    Marital Status: Never married    Allergies: No Known Allergies  Metabolic Disorder Labs: No results found for: HGBA1C, MPG Lab Results  Component Value Date   PROLACTIN 19.6 (H) 01/06/2018   Lab Results  Component Value Date   CHOL 232 (H) 01/06/2018   TRIG 165 (H) 01/06/2018   HDL 80 01/06/2018   CHOLHDL 2.9 01/06/2018   VLDL NOT CALC 05/09/2016   LDLCALC 124 (H) 01/06/2018   LDLCALC 111 (H) 08/27/2017   Lab Results  Component Value Date   TSH 2.03 01/06/2018    Therapeutic Level Labs: No results found for: LITHIUM No results found for: VALPROATE No components found for:  CBMZ  Current Medications: Current Outpatient Medications  Medication Sig Dispense Refill   bictegravir-emtricitabine-tenofovir AF (BIKTARVY) 50-200-25 MG TABS tablet Take 1 tablet by mouth daily. 30 tablet 11   FLUoxetine (PROZAC) 20 MG tablet Take 1 tablet (20 mg total) by mouth daily. 30 tablet 2   risperidone (RISPERDAL) 4 MG tablet Take 1 tablet (4 mg total) by mouth at bedtime. 30 tablet 2   No current facility-administered medications for this visit.     Musculoskeletal: Strength & Muscle Tone: within normal limits Gait & Station: normal Patient leans: N/A  Psychiatric Specialty Exam: Review of Systems  Psychiatric/Behavioral:  Negative for decreased concentration, dysphoric mood, hallucinations, self-injury, sleep disturbance and suicidal ideas. The patient is not nervous/anxious and is not hyperactive.    Blood pressure (!) 171/125, pulse (!) 117, height 6\' 2"  (1.88 m), weight 134 lb (60.8 kg).Body mass index is 17.2 kg/m.  General Appearance: Fairly Groomed  Eye Contact:  Good  Speech:  Clear and Coherent and Normal Rate  Volume:  Normal  Mood:  Euthymic  Affect:  Appropriate  Thought Process:  Coherent and Descriptions of Associations: Intact  Orientation:  Full (Time, Place, and Person)  Thought Content: WDL   Suicidal Thoughts:  No  Homicidal Thoughts:  No  Memory:   Immediate;   Good Recent;   Good Remote;   Good  Judgement:  Good  Insight:  Fair  Psychomotor Activity:  Normal  Concentration:  Concentration: Good and Attention Span: Good  Recall:  Good  Fund of Knowledge: Good  Language: Good  Akathisia:  NA  Handed:  Right  AIMS (if indicated): not done  Assets:  Communication Skills Desire for Improvement Housing Resilience  ADL's:  Intact  Cognition: WNL  Sleep:  Good   Screenings: GAD-7    Flowsheet Row Clinical Support from 10/26/2020 in [facility] Video Visit from 06/23/2020 in [facility]  Total GAD-7 Score 2 7      PHQ2-9    Flowsheet Row Clinical Support from 10/26/2020 in [facility] Video Visit from 06/23/2020 in [facility] Counselor from 01[facility]10/2020 in [facility] Office Visit from 01/06/2018 in [facility]  for Infectious Disease Office Visit from 09/02/2017 in [facility] for Infectious Disease  PHQ-2 Total Score 0 0 0 0 0      Flowsheet Row Clinical Support from 10/26/2020 in [facility] Video Visit from 06/23/2020 in [facility]  C-SSRS RISK CATEGORY No Risk Low Risk        Assessment and Plan:   Bradley Hamilton is a 50 year old male with a past psychiatric history significant for psychosis and major depressive disorder who presents to [facility] for follow-up and medication management.  Patient reports no issues or concerns regarding his current medication regimen.  Patient denies the need for dosage adjustments at this time and is requesting refills on all of his medications following the conclusion of the encounter.  Patient's medications to be e-prescribed to pharmacy of choice.  1. Psychosis, unspecified psychosis type (HCC)  - risperidone  (RISPERDAL) 4 MG tablet; Take 1 tablet (4 mg total) by mouth at bedtime.  Dispense: 30 tablet; Refill: 2  2. Major depressive disorder, recurrent episode, moderate (HCC)  - FLUoxetine (PROZAC) 20 MG tablet; Take 1 tablet (20 mg total) by mouth daily.  Dispense: 30 tablet; Refill: 2  Patient to follow up in 2 months Provider spent a total of 15 minutes with the patient/reviewing patient's chart  Meta Hatchet, PA 11/03/2020, 1:31 AM

## 2020-11-03 ENCOUNTER — Encounter (HOSPITAL_COMMUNITY): Payer: Self-pay | Admitting: Physician Assistant

## 2020-12-26 ENCOUNTER — Encounter (HOSPITAL_COMMUNITY): Payer: No Payment, Other | Admitting: Physician Assistant

## 2021-04-23 ENCOUNTER — Other Ambulatory Visit: Payer: Self-pay

## 2021-04-23 ENCOUNTER — Telehealth (HOSPITAL_COMMUNITY): Payer: No Payment, Other | Admitting: Physician Assistant

## 2021-04-25 ENCOUNTER — Telehealth (HOSPITAL_COMMUNITY): Payer: No Payment, Other | Admitting: Physician Assistant

## 2021-05-08 ENCOUNTER — Encounter (HOSPITAL_COMMUNITY): Payer: Self-pay | Admitting: Physician Assistant

## 2021-05-08 ENCOUNTER — Telehealth (INDEPENDENT_AMBULATORY_CARE_PROVIDER_SITE_OTHER): Payer: No Payment, Other | Admitting: Physician Assistant

## 2021-05-08 DIAGNOSIS — F29 Unspecified psychosis not due to a substance or known physiological condition: Secondary | ICD-10-CM

## 2021-05-08 DIAGNOSIS — F331 Major depressive disorder, recurrent, moderate: Secondary | ICD-10-CM | POA: Diagnosis not present

## 2021-05-08 MED ORDER — RISPERIDONE 4 MG PO TABS: 4.0000 mg | ORAL_TABLET | Freq: Every day | ORAL | 3 refills | Status: DC

## 2021-05-08 MED ORDER — FLUOXETINE HCL 20 MG PO TABS: 20.0000 mg | ORAL_TABLET | Freq: Every day | ORAL | 3 refills | Status: DC

## 2021-05-08 NOTE — Progress Notes (Addendum)
BH MD/PA/NP OP Progress Note ? ?Virtual Visit via Video Note ? ?I connected with Bradley Hamilton on 05/08/21 at  3:00 PM EDT by a video enabled telemedicine application and verified that I am speaking with the correct person using two identifiers. ? ?Location: ?Patient: Home ?Provider: Clinic ?  ?I discussed the limitations of evaluation and management by telemedicine and the availability of in person appointments. The patient expressed understanding and agreed to proceed. ? ?Follow Up Instructions: ?  ?I discussed the assessment and treatment plan with the patient. The patient was provided an opportunity to ask questions and all were answered. The patient agreed with the plan and demonstrated an understanding of the instructions. ?  ?The patient was advised to call back or seek an in-person evaluation if the symptoms worsen or if the condition fails to improve as anticipated. ? ?I provided 9 minutes of non-face-to-face time during this encounter. ? ?Meta Hatchet, PA ? ? ?05/08/2021 8:57 PM ?Bradley Hamilton  ?MRN:  [id number] ? ?Chief Complaint:  ?Chief Complaint  ?Patient presents with  ? Follow-up  ? ?HPI:  ? ?Bradley Hamilton is a 51 year old male with a past psychiatric history significant for psychosis major depressive disorder who presents to [facility] via virtual video visit for follow-up and medication management.  Patient is currently being managed on the following medications: ? ?Fluoxetine 20 mg daily ?Risperidone 4 mg at bedtime ? ?Patient reports no issues or concerns regarding his current medication regimen.  Patient denies experiencing any side effects in taking his medications.  Patient denies depressive symptoms but he does endorse feeling tired during the day and night.  Patient denies any changes in his other medications that he is taking nor does he endorse any major life changing events.  He reports that his fatigue has been an issue for 3 months and  denies having a primary care provider.  Patient denies anxiety.  A GAD-7 screen was performed with the patient scoring a 5. ? ?Patient is alert and oriented x4, calm, cooperative, and fully engaged in conversation during the encounter.  Patient endorses pleasant mood.  Patient denies suicidal or homicidal ideations.  He further denies auditory or visual hallucinations and does not appear to be responding to internal/external stimuli.  Patient endorses good sleep and receives on average 6 to 7 hours of sleep each night.  Patient endorses decreased appetite that has been going on for the last 2 weeks.  Patient eats on average 2 meals per day.  Patient denies alcohol consumption or illicit drug use.  Patient endorses tobacco use and smokes on average 12 cigarettes/day. ? ?Visit Diagnosis:  ?  ICD-10-CM   ?1. Psychosis, unspecified psychosis type (HCC)  F29 risperidone (RISPERDAL) 4 MG tablet  ?  ?2. Major depressive disorder, recurrent episode, moderate (HCC)  F33.1 FLUoxetine (PROZAC) 20 MG tablet  ?  ? ? ?Past Psychiatric History:  ?Major depressive disorder ?Unspecified psychosis ? ?Past Medical History:  ?Past Medical History:  ?Diagnosis Date  ? Cough 05/20/2016  ? Gynecomastia 01/06/2018  ? Heart murmur 05/20/2016  ? HIV infection (HCC) 05/20/2016  ? Night sweats 05/20/2016  ? Smoker 05/20/2016  ? History reviewed. No pertinent surgical history. ? ?Family Psychiatric History:  ?None ? ?Family History: History reviewed. No pertinent family history. ? ?Social History:  ?Social History  ? ?Socioeconomic History  ? Marital status: Single  ?  Spouse name: Not on file  ? Number of children: Not on  file  ? Years of education: Not on file  ? Highest education level: Not on file  ?Occupational History  ? Not on file  ?Tobacco Use  ? Smoking status: Every Day  ?  Packs/day: 1.00  ?  Types: Cigarettes  ? Smokeless tobacco: Never  ?Vaping Use  ? Vaping Use: Never used  ?Substance and Sexual Activity  ? Alcohol use: Yes  ?   Alcohol/week: 3.0 standard drinks  ?  Types: 3 Cans of beer per week  ? Drug use: No  ? Sexual activity: Not Currently  ?  Comment: declined condoms  ?Other Topics Concern  ? Not on file  ?Social History Narrative  ? Not on file  ? ?Social Determinants of Health  ? ?Financial Resource Strain: Not on file  ?Food Insecurity: Not on file  ?Transportation Needs: Not on file  ?Physical Activity: Not on file  ?Stress: Not on file  ?Social Connections: Not on file  ? ? ?Allergies: No Known Allergies ? ?Metabolic Disorder Labs: ?No results found for: HGBA1C, MPG ?Lab Results  ?Component Value Date  ? PROLACTIN 19.6 (H) 01/06/2018  ? ?Lab Results  ?Component Value Date  ? CHOL 232 (H) 01/06/2018  ? TRIG 165 (H) 01/06/2018  ? HDL 80 01/06/2018  ? CHOLHDL 2.9 01/06/2018  ? VLDL NOT CALC 05/09/2016  ? LDLCALC 124 (H) 01/06/2018  ? LDLCALC 111 (H) 08/27/2017  ? ?Lab Results  ?Component Value Date  ? TSH 2.03 01/06/2018  ? ? ?Therapeutic Level Labs: ?No results found for: LITHIUM ?No results found for: VALPROATE ?No components found for:  CBMZ ? ?Current Medications: ?Current Outpatient Medications  ?Medication Sig Dispense Refill  ? bictegravir-emtricitabine-tenofovir AF (BIKTARVY) 50-200-25 MG TABS tablet Take 1 tablet by mouth daily. 30 tablet 11  ? FLUoxetine (PROZAC) 20 MG tablet Take 1 tablet (20 mg total) by mouth daily. 30 tablet 3  ? risperidone (RISPERDAL) 4 MG tablet Take 1 tablet (4 mg total) by mouth at bedtime. 30 tablet 3  ? ?No current facility-administered medications for this visit.  ? ? ? ?Musculoskeletal: ?Strength & Muscle Tone: Unable to assess due to telemedicine visit ?Gait & Station: Unable to assess due to telemedicine visit ?Patient leans: Unable to assess due to telemedicine visit ? ?Psychiatric Specialty Exam: ?Review of Systems  ?Psychiatric/Behavioral:  Negative for decreased concentration, dysphoric mood, hallucinations, self-injury, sleep disturbance and suicidal ideas. The patient is not  nervous/anxious and is not hyperactive.    ?There were no vitals taken for this visit.There is no height or weight on file to calculate BMI.  ?General Appearance: Casual  ?Eye Contact:  Good  ?Speech:  Clear and Coherent and Normal Rate  ?Volume:  Normal  ?Mood:  Euthymic  ?Affect:  Appropriate  ?Thought Process:  Coherent and Descriptions of Associations: Intact  ?Orientation:  Full (Time, Place, and Person)  ?Thought Content: WDL   ?Suicidal Thoughts:  No  ?Homicidal Thoughts:  No  ?Memory:  Immediate;   Good ?Recent;   Good ?Remote;   Good  ?Judgement:  Good  ?Insight:  Fair  ?Psychomotor Activity:  Normal  ?Concentration:  Concentration: Good and Attention Span: Good  ?Recall:  Good  ?Fund of Knowledge: Good  ?Language: Good  ?Akathisia:  No  ?Handed:  Right  ?AIMS (if indicated): not done  ?Assets:  Communication Skills ?Desire for Improvement ?Housing ?Resilience  ?ADL's:  Intact  ?Cognition: WNL  ?Sleep:  Good  ? ?Screenings: ?GAD-7   ? ?Flowsheet Row Video Visit  from 05/08/2021 in [facility] Clinical Support from 10/26/2020 in [facility] Video Visit from 06/23/2020 in [facility]  ?Total GAD-7 Score 5 2 7   ? ?  ? ?PHQ2-9   ? ?Flowsheet Row Video Visit from 05/08/2021 in [facility] Clinical Support from 10/26/2020 in [facility] Video Visit from 06/23/2020 in [facility] Counselor from 01/10/2020 in [facility] Office Visit from 01/06/2018 in [facility]  ?PHQ-2 Total Score 1 0 0 0 0  ? ?  ? ?Flowsheet Row Video Visit from 05/08/2021 in [facility] Clinical Support from 10/26/2020 in [facility] Video Visit from 06/23/2020 in [facility]  ?C-SSRS RISK CATEGORY No Risk No Risk Low Risk  ? ?   ? ? ? ?Assessment and Plan:  ? ?Bradley Hamilton is a 51 year old male with a past psychiatric history significant for psychosis major depressive disorder who presents to [facility]

## 2021-07-11 ENCOUNTER — Telehealth (HOSPITAL_COMMUNITY): Payer: Self-pay | Admitting: *Deleted
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
## 2021-07-11 NOTE — Telephone Encounter (Signed)
Provider was contacted by Eliezer Lofts, RMA regarding pharmacy message. Provider to allow early refill of patient's medication.

## 2021-08-08 ENCOUNTER — Telehealth (HOSPITAL_COMMUNITY): Payer: No Payment, Other | Admitting: Student in an Organized Health Care Education/Training Program

## 2021-08-08 DIAGNOSIS — F29 Unspecified psychosis not due to a substance or known physiological condition: Secondary | ICD-10-CM

## 2021-08-08 DIAGNOSIS — F331 Major depressive disorder, recurrent, moderate: Secondary | ICD-10-CM

## 2021-08-08 NOTE — Progress Notes (Signed)
Attempted a virtual visit with patient, however, patient did not show up for the appointment.  We will reschedule a follow up appointment for a later time.    Arna Snipe MD Resident

## 2022-07-22 ENCOUNTER — Encounter (HOSPITAL_COMMUNITY): Payer: Self-pay | Admitting: Registered Nurse

## 2022-07-22 ENCOUNTER — Ambulatory Visit (HOSPITAL_COMMUNITY)
Admission: EM | Admit: 2022-07-22 | Discharge: 2022-07-22 | Disposition: A | Discharge status: Home / Self Care | Payer: Medicaid Other | Attending: Registered Nurse | Admitting: Registered Nurse

## 2022-07-22 ENCOUNTER — Other Ambulatory Visit (HOSPITAL_COMMUNITY): Payer: Self-pay

## 2022-07-22 DIAGNOSIS — F331 Major depressive disorder, recurrent, moderate: Secondary | ICD-10-CM | POA: Diagnosis present

## 2022-07-22 DIAGNOSIS — F29 Unspecified psychosis not due to a substance or known physiological condition: Secondary | ICD-10-CM | POA: Insufficient documentation

## 2022-07-22 DIAGNOSIS — F22 Delusional disorders: Secondary | ICD-10-CM | POA: Diagnosis present

## 2022-07-22 LAB — CBC WITH DIFFERENTIAL/PLATELET
Abs Immature Granulocytes: 0.03 10*3/uL (ref 0.00–0.07)
Basophils Absolute: 0 10*3/uL (ref 0.0–0.1)
Basophils Relative: 0 %
Eosinophils Absolute: 0 10*3/uL (ref 0.0–0.5)
Eosinophils Relative: 0 %
HCT: 35.6 % — ABNORMAL LOW (ref 39.0–52.0)
Hemoglobin: 12.9 g/dL — ABNORMAL LOW (ref 13.0–17.0)
Immature Granulocytes: 1 %
Lymphocytes Relative: 27 %
Lymphs Abs: 1.2 10*3/uL (ref 0.7–4.0)
MCH: 37.4 pg — ABNORMAL HIGH (ref 26.0–34.0)
MCHC: 36.2 g/dL — ABNORMAL HIGH (ref 30.0–36.0)
MCV: 103.2 fL — ABNORMAL HIGH (ref 80.0–100.0)
Monocytes Absolute: 0.5 10*3/uL (ref 0.1–1.0)
Monocytes Relative: 11 %
Neutro Abs: 2.7 10*3/uL (ref 1.7–7.7)
Neutrophils Relative %: 61 %
Platelets: 252 10*3/uL (ref 150–400)
RBC: 3.45 MIL/uL — ABNORMAL LOW (ref 4.22–5.81)
RDW: 15.7 % — ABNORMAL HIGH (ref 11.5–15.5)
WBC: 4.4 10*3/uL (ref 4.0–10.5)
nRBC: 0 % (ref 0.0–0.2)

## 2022-07-22 LAB — COMPREHENSIVE METABOLIC PANEL
ALT: 21 U/L (ref 0–44)
AST: 40 U/L (ref 15–41)
Albumin: 3.4 g/dL — ABNORMAL LOW (ref 3.5–5.0)
Alkaline Phosphatase: 218 U/L — ABNORMAL HIGH (ref 38–126)
Anion gap: 9 (ref 5–15)
BUN: 18 mg/dL (ref 6–20)
CO2: 24 mmol/L (ref 22–32)
Calcium: 9.3 mg/dL (ref 8.9–10.3)
Chloride: 104 mmol/L (ref 98–111)
Creatinine, Ser: 0.96 mg/dL (ref 0.61–1.24)
GFR, Estimated: >60 mL/min (ref >60)
Glucose, Bld: 105 mg/dL — ABNORMAL HIGH (ref 70–99)
Potassium: 3.5 mmol/L (ref 3.5–5.1)
Sodium: 137 mmol/L (ref 135–145)
Total Bilirubin: 0.4 mg/dL (ref 0.3–1.2)
Total Protein: 9.5 g/dL — ABNORMAL HIGH (ref 6.5–8.1)

## 2022-07-22 LAB — LIPID PANEL
Cholesterol: 153 mg/dL (ref 0–200)
HDL: 60 mg/dL (ref >40)
LDL Cholesterol: 74 mg/dL (ref 0–99)
Total CHOL/HDL Ratio: 2.6 RATIO
Triglycerides: 94 mg/dL (ref <150)
VLDL: 19 mg/dL (ref 0–40)

## 2022-07-22 LAB — MAGNESIUM: Magnesium: 1.9 mg/dL (ref 1.7–2.4)

## 2022-07-22 LAB — HEMOGLOBIN A1C
Hgb A1c MFr Bld: 5.1 % (ref 4.8–5.6)
Mean Plasma Glucose: 99.67 mg/dL

## 2022-07-22 LAB — TSH: TSH: 2.514 u[IU]/mL (ref 0.350–4.500)

## 2022-07-22 MED ORDER — RISPERIDONE ER 100 MG/0.28ML ~~LOC~~ SUSY: 100.0000 mg | PREFILLED_SYRINGE | Freq: Once | SUBCUTANEOUS | Status: DC

## 2022-07-22 MED ORDER — RISPERIDONE ER 100 MG/0.28ML ~~LOC~~ SUSY
100.0000 mg | PREFILLED_SYRINGE | Freq: Once | SUBCUTANEOUS | Status: AC
  Administered 2022-07-22: 100 mg via SUBCUTANEOUS

## 2022-07-22 MED ORDER — FLUOXETINE HCL 20 MG PO TABS
20.0000 mg | ORAL_TABLET | Freq: Every day | ORAL | 0 refills | Status: DC
Start: 2022-07-22 — End: 2022-10-25

## 2022-07-22 MED ORDER — UZEDY 100 MG/0.28ML ~~LOC~~ SUSY: 100.0000 mg | PREFILLED_SYRINGE | Freq: Once | SUBCUTANEOUS | 0 refills | Status: DC

## 2022-07-22 NOTE — Discharge Instructions (Addendum)
  [facility]: Outpatient psychiatric Services:   Please see the walk in hours listed below.  Medication Management New Patient needing Medication Management Walk-in, and Existing Patients needing to see a provider for management coming as a walk in   Monday thru Friday 8:00 AM first come first serve until slots are full.  Recommend being there by 7:15 AM to ensure a slot is open.  Therapy New Patient Therapy Intake and Existing Patients needing to see therapist coming in as a walk in.   Monday, Wednesday, and Thursday morning at 8:00 am first come first serve.  Recommend being there by 7:15 AM to ensure a slot is open.    Every 1st, 2nd, and 3rd Friday at 1:00 PM first come first serve until slots are full.  Will still need to come in that morning at 7:15 AM to get registered for an afternoon slot.  For all walk-ins we ask that you arrive by 7:15 am because patients will be seen in there order of arrival (FIRST COME FIRST SERVE) Availability is limited, therefore you may not be seen on the same day that you walk in if all slots are full.    Our goal is to serve and meet the needs of our community to the best of our ability.   
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
## 2022-07-22 NOTE — TOC Benefit Eligibility Note (Signed)
Pharmacy Patient Advocate Encounter  Insurance verification completed.    The patient is insured through [facility] MEDICAID    Ran test claim for [facility] and the current 30 day co-pay is $4.00.   This test claim was processed through [facility]- copay amounts may vary at other pharmacies due to pharmacy/plan contracts, or as the patient moves through the different stages of their insurance plan.

## 2022-07-22 NOTE — ED Provider Notes (Signed)
Behavioral Health Urgent Care Medical Screening Exam  Patient Name: Bradley Hamilton MRN: [id number] Date of Evaluation: 07/22/22 Chief Complaint:   Diagnosis:  Final diagnoses:  Major depressive disorder, recurrent episode, moderate (HCC)  Paranoia (psychosis) (HCC)    History of Present illness: Bradley Hamilton is a 52 y.o. male patient presented to [facility] as a walk in accompanied by his mother and aunt with complaints of worsening paranoia and wanting to get back on his medication.    Bradley Hamilton, 52 y.o., male patient seen face to face by this provider, consulted with Dr. Nelly Rout, and chart reviewed on 07/22/22.  On evaluation Bradley Hamilton reports he is out of his Risperdal and Prozac and would like prescriptions to get back on medications.  Patient states it has been 5-7 days since he last took medications.  He reports that his paranoia has worsened.  "Somebody is breaking in my house.  Even when I lock my doors and put the dead bolt on the can still get in."  Patient states he has placed certain things on the counter and laid out money "and on the next day when your stuff is moved or the money is gone.  That's how I know it's really happening and that's how I trap them."  Patient states that his landlord and family tell him that it is not really happening "That I'm just paranoid."  Patient denies suicidal/self-harm/homicidal ideation and psychosis.  Patient states he has outpatient psychiatric services with [facility] and sees [place] for medication management but ran out of refills.   During evaluation Bradley Hamilton is sitting in chair with no noted distress.  He is alert/oriented x 4, calm, cooperative, attentive, and responses were relevant and appropriate to assessment questions.  He spoke in a clear tone at moderate volume, and normal pace, with good eye contact.   He denies suicidal/self-harm/homicidal ideation, and auditory/visual hallucinations but endorses chronic  paranoia that has worsened.  Objectively:  there is no evidence of psychosis/mania.  However it is evident by patients statement that he is paranoid, delusional thinking that someone is breaking into his home daily while he is awake and when he is sleep.   His conversation is coherent, with goal directed thoughts, and no distractibility, or pre-occupation. Discussed staring long acting injectable.  Patient agreeable to start long acting and follow up monthly in shot clinic.  Discussed starting Uzedy since he is currently taking Risperdal with no adverse reaction and he states that the medication has worked well for him.  Discussed efficacy and side effects.  Understanding voiced and educational material also given  Will gather labs for baseline EKG to rule out prolong QTc Prescribe Uzedy 100 mg Q 28 days.   Flowsheet Row ED from 07/22/2022 in [facility] Video Visit from 05/08/2021 in [facility] Clinical Support from 10/26/2020 in [facility]  C-SSRS RISK CATEGORY No Risk No Risk No Risk       Psychiatric Specialty Exam  Presentation  General Appearance:Appropriate for Environment  Eye Contact:Good  Speech:Clear and Coherent; Normal Rate  Speech Volume:Normal  Handedness:Right   Mood and Affect  Mood: Anxious  Affect: Congruent   Thought Process  Thought Processes: Coherent; Goal Directed  Descriptions of Associations:Intact  Orientation:Full (Time, Place and Person)  Thought Content:Paranoid Ideation; Logical  Diagnosis of Schizophrenia or Schizoaffective disorder in past: No   Hallucinations:None  Ideas of Reference:None  Suicidal  Thoughts:No  Homicidal Thoughts:No   Sensorium  Memory: Immediate Good; Recent Good  Judgment: Intact  Insight: Present   Executive Functions  Concentration: Good  Attention Span: Good  Recall: Good  Fund of  Knowledge: Good  Language: Good   Psychomotor Activity  Psychomotor Activity: Normal   Assets  Assets: Communication Skills; Desire for Improvement; Financial Resources/Insurance; Housing; Physical Health; Resilience; Social Support   Sleep  Sleep: Fair  Number of hours: No data recorded  Physical Exam: Physical Exam Vitals and nursing note reviewed. Chaperone present: Accompanied by his mother and aunt.  Constitutional:      General: He is not in acute distress.    Appearance: Normal appearance. He is not ill-appearing.  HENT:     Head: Normocephalic.  Eyes:     Conjunctiva/sclera: Conjunctivae normal.  Cardiovascular:     Rate and Rhythm: Normal rate.  Pulmonary:     Effort: Pulmonary effort is normal. No respiratory distress.  Musculoskeletal:        General: Normal range of motion.     Cervical back: Normal range of motion.  Skin:    General: Skin is warm and dry.  Neurological:     Mental Status: He is alert and oriented to person, place, and time.  Psychiatric:        Attention and Perception: Attention and perception normal. He does not perceive auditory or visual hallucinations.        Mood and Affect: Affect normal. Mood is anxious.        Speech: Speech normal.        Behavior: Behavior normal. Behavior is cooperative.        Thought Content: Thought content is paranoid. Thought content does not include homicidal or suicidal ideation.        Judgment: Judgment is impulsive.   Review of Systems  Constitutional:        No other complaints voiced  Psychiatric/Behavioral:  Depression: Stable. Hallucinations: Denies. Suicidal ideas: Denies. The patient has insomnia.        Reporting paranoia, chronic  All other systems reviewed and are negative.  There were no vitals taken for this visit. There is no height or weight on file to calculate BMI.  Musculoskeletal: Strength & Muscle Tone: within normal limits Gait & Station: normal Patient leans:  N/A   [facility] MSE Discharge Disposition for Follow up and Recommendations: Based on my evaluation the patient does not appear to have an emergency medical condition and can be discharged with resources and follow up care in outpatient services for Medication Management   Lab Orders         SARS Coronavirus 2 by RT PCR (hospital order, performed in [facility] hospital lab) *cepheid single result test* Anterior Nasal Swab         Prolactin         TSH         Lipid panel         Hemoglobin A1c         Comprehensive metabolic panel         CBC with Differential/Platelet         Magnesium      Meds ordered this encounter  Medications  . DISCONTD: risperiDONE ER SUSY 100 mg  . risperiDONE ER SUSY 100 mg  . FLUoxetine (PROZAC) 20 MG tablet    Sig: Take 1 tablet (20 mg total) by mouth daily.    Dispense:  30 tablet  Refill:  0    Order Specific Question:   Supervising Provider    Answer:   Nelly Rout [[id number]]  . risperiDONE ER (UZEDY) 100 MG/0.28ML SUSY    Sig: Inject 0.28 mLs (100 mg total) into the skin once for 1 dose.    Dispense:  0.28 mL    Refill:  0    First dose 07/22/22 next dose due in 28 days.  Please deliver medication to [facility] [address] [place], [place] [address] attention:  Antony Salmon.    Order Specific Question:   Supervising Provider    Answer:   Nelly Rout [[id number]]     Darriana Deboy, NP 07/22/2022, 2:30 PM

## 2022-07-24 LAB — PROLACTIN: Prolactin: 3.3 ng/mL — ABNORMAL LOW (ref 3.9–22.7)

## 2022-08-05 ENCOUNTER — Other Ambulatory Visit: Payer: Self-pay

## 2022-08-05 ENCOUNTER — Ambulatory Visit (HOSPITAL_COMMUNITY)
Admission: EM | Admit: 2022-08-05 | Discharge: 2022-08-05 | Disposition: A | Discharge status: Other Acute Inpatient Hospital | Payer: MEDICAID | Attending: Psychiatry | Admitting: Psychiatry

## 2022-08-05 DIAGNOSIS — Z91148 Patient's other noncompliance with medication regimen for other reason: Secondary | ICD-10-CM | POA: Insufficient documentation

## 2022-08-05 DIAGNOSIS — Z79899 Other long term (current) drug therapy: Secondary | ICD-10-CM | POA: Insufficient documentation

## 2022-08-05 DIAGNOSIS — F333 Major depressive disorder, recurrent, severe with psychotic symptoms: Secondary | ICD-10-CM | POA: Diagnosis not present

## 2022-08-05 DIAGNOSIS — F17200 Nicotine dependence, unspecified, uncomplicated: Secondary | ICD-10-CM | POA: Insufficient documentation

## 2022-08-05 DIAGNOSIS — Z560 Unemployment, unspecified: Secondary | ICD-10-CM | POA: Diagnosis not present

## 2022-08-05 DIAGNOSIS — Z21 Asymptomatic human immunodeficiency virus [HIV] infection status: Secondary | ICD-10-CM | POA: Diagnosis not present

## 2022-08-05 LAB — COMPREHENSIVE METABOLIC PANEL
ALT: 18 U/L (ref 0–44)
AST: 35 U/L (ref 15–41)
Albumin: 3.1 g/dL — ABNORMAL LOW (ref 3.5–5.0)
Alkaline Phosphatase: 143 U/L — ABNORMAL HIGH (ref 38–126)
Anion gap: 9 (ref 5–15)
BUN: 12 mg/dL (ref 6–20)
CO2: 22 mmol/L (ref 22–32)
Calcium: 8.5 mg/dL — ABNORMAL LOW (ref 8.9–10.3)
Chloride: 106 mmol/L (ref 98–111)
Creatinine, Ser: 1.03 mg/dL (ref 0.61–1.24)
GFR, Estimated: >60 mL/min (ref >60)
Glucose, Bld: 90 mg/dL (ref 70–99)
Potassium: 3.9 mmol/L (ref 3.5–5.1)
Sodium: 137 mmol/L (ref 135–145)
Total Bilirubin: 0.5 mg/dL (ref 0.3–1.2)
Total Protein: 8 g/dL (ref 6.5–8.1)

## 2022-08-05 LAB — CBC WITH DIFFERENTIAL/PLATELET
Abs Immature Granulocytes: 0.01 10*3/uL (ref 0.00–0.07)
Basophils Absolute: 0 10*3/uL (ref 0.0–0.1)
Basophils Relative: 0 %
Eosinophils Absolute: 0.1 10*3/uL (ref 0.0–0.5)
Eosinophils Relative: 4 %
HCT: 33.7 % — ABNORMAL LOW (ref 39.0–52.0)
Hemoglobin: 11.4 g/dL — ABNORMAL LOW (ref 13.0–17.0)
Immature Granulocytes: 0 %
Lymphocytes Relative: 44 %
Lymphs Abs: 1.3 10*3/uL (ref 0.7–4.0)
MCH: 33.8 pg (ref 26.0–34.0)
MCHC: 33.8 g/dL (ref 30.0–36.0)
MCV: 100 fL (ref 80.0–100.0)
Monocytes Absolute: 0.4 10*3/uL (ref 0.1–1.0)
Monocytes Relative: 15 %
Neutro Abs: 1.1 10*3/uL — ABNORMAL LOW (ref 1.7–7.7)
Neutrophils Relative %: 37 %
Platelets: 196 10*3/uL (ref 150–400)
RBC: 3.37 MIL/uL — ABNORMAL LOW (ref 4.22–5.81)
RDW: 14.9 % (ref 11.5–15.5)
WBC: 2.9 10*3/uL — ABNORMAL LOW (ref 4.0–10.5)
nRBC: 0 % (ref 0.0–0.2)

## 2022-08-05 LAB — POCT URINE DRUG SCREEN - MANUAL ENTRY (I-SCREEN)
POC Amphetamine UR: NOT DETECTED
POC Buprenorphine (BUP): NOT DETECTED
POC Cocaine UR: NOT DETECTED
POC Marijuana UR: NOT DETECTED
POC Methadone UR: NOT DETECTED
POC Methamphetamine UR: NOT DETECTED
POC Morphine: NOT DETECTED
POC Oxazepam (BZO): NOT DETECTED
POC Oxycodone UR: NOT DETECTED
POC Secobarbital (BAR): NOT DETECTED

## 2022-08-05 LAB — URINALYSIS, COMPLETE (UACMP) WITH MICROSCOPIC
Bilirubin Urine: NEGATIVE
Glucose, UA: NEGATIVE mg/dL
Hgb urine dipstick: NEGATIVE
Ketones, ur: NEGATIVE mg/dL
Nitrite: NEGATIVE
Protein, ur: 30 mg/dL — AB
Specific Gravity, Urine: 1.012 (ref 1.005–1.030)
pH: 5 (ref 5.0–8.0)

## 2022-08-05 LAB — ETHANOL: Alcohol, Ethyl (B): 110 mg/dL — ABNORMAL HIGH (ref <10)

## 2022-08-05 MED ORDER — ADULT MULTIVITAMIN W/MINERALS CH
1.0000 | ORAL_TABLET | Freq: Every day | ORAL | Status: DC
  Administered 2022-08-05: 1 via ORAL
  Filled 2022-08-05: qty 1

## 2022-08-05 MED ORDER — THIAMINE MONONITRATE 100 MG PO TABS
100.0000 mg | ORAL_TABLET | Freq: Every day | ORAL | Status: DC
  Administered 2022-08-05: 100 mg via ORAL
  Filled 2022-08-05: qty 1

## 2022-08-05 MED ORDER — TRAZODONE HCL 50 MG PO TABS: 50.0000 mg | ORAL_TABLET | Freq: Every evening | ORAL | Status: DC | PRN

## 2022-08-05 MED ORDER — ACETAMINOPHEN 325 MG PO TABS: 650.0000 mg | ORAL_TABLET | Freq: Four times a day (QID) | ORAL | Status: DC | PRN

## 2022-08-05 MED ORDER — ALUM & MAG HYDROXIDE-SIMETH 200-200-20 MG/5ML PO SUSP: 30.0000 mL | ORAL | Status: DC | PRN

## 2022-08-05 MED ORDER — THIAMINE HCL 100 MG/ML IJ SOLN
100.0000 mg | Freq: Once | INTRAMUSCULAR | Status: DC
  Filled 2022-08-05: qty 2

## 2022-08-05 MED ORDER — THIAMINE MONONITRATE 100 MG PO TABS: 100.0000 mg | ORAL_TABLET | Freq: Every day | ORAL | Status: DC

## 2022-08-05 MED ORDER — LORAZEPAM 1 MG PO TABS: 1.0000 mg | ORAL_TABLET | Freq: Four times a day (QID) | ORAL | Status: DC | PRN

## 2022-08-05 MED ORDER — LOPERAMIDE HCL 2 MG PO CAPS: 2.0000 mg | ORAL_CAPSULE | ORAL | Status: DC | PRN

## 2022-08-05 MED ORDER — MAGNESIUM HYDROXIDE 400 MG/5ML PO SUSP: 30.0000 mL | Freq: Every day | ORAL | Status: DC | PRN

## 2022-08-05 MED ORDER — NICOTINE 21 MG/24HR TD PT24
21.0000 mg | MEDICATED_PATCH | Freq: Every day | TRANSDERMAL | Status: DC
  Administered 2022-08-05: 21 mg via TRANSDERMAL
  Filled 2022-08-05: qty 1

## 2022-08-05 MED ORDER — HYDROXYZINE HCL 25 MG PO TABS
25.0000 mg | ORAL_TABLET | Freq: Four times a day (QID) | ORAL | Status: DC | PRN
  Filled 2022-08-05: qty 1

## 2022-08-05 MED ORDER — ONDANSETRON 4 MG PO TBDP: 4.0000 mg | ORAL_TABLET | Freq: Four times a day (QID) | ORAL | Status: DC | PRN

## 2022-08-05 MED ORDER — NICOTINE POLACRILEX 2 MG MT GUM: 2.0000 mg | CHEWING_GUM | OROMUCOSAL | Status: DC | PRN

## 2022-08-05 NOTE — ED Provider Notes (Cosign Needed Addendum)
[facility] Medical Screening Exam   Date: 08/05/22 Patient Name: Bradley Hamilton MRN: [id number] Chief Complaint: brought in under IVC due to paranoia and erratic behaviors   Diagnoses:  Final diagnoses:  MDD (major depressive disorder), recurrent, severe, with psychosis (HCC)    HPI: patient presented to [facility] as a walk in accompanied by [employer] under IVC due to paranoia and erratic behaviors.   IVC petitioner is Bradley Hamilton (patient's mother).  IVC findings are as follows, "the respondent has been diagnosed with schizophrenia and major depressive disorders.  The respondent was prescribed risperidone and fluoxetine which she does not take regularly.  Respondent presents is paranoid and fears people are breaking into his apartment.  Respondent reports hearing voices.  The respondent is abusing alcohol.  The respondent walks outside wearing only his underwear.  The respondent aggressively approaches his neighbors and locks on the doors.  The respondent has a history of commitment in the past and is a danger to himself and others".  Bradley Hamilton, 52 y.o., male patient seen face to face by this provider and chart reviewed on 08/05/22.  Per chart review patient has a past psychiatric history of MDD with psychosis.  He has a medical history of HIV.  He has been prescribed Biktarvy in the past but states he has not taken medication in quite some time.  He has no follow-up with infectious disease.  Patient was seen at [facility] on 07/22/2022 he received a use 80 injection 100 mg/0.2 ML at that time next injection is due 08/19/2022.  He denies having any outpatient psychiatric providers.  Per chart review patient had services in place with [facility] behavioral health on the second floor but has not been seen since 07/11/2021.  Reports he currently does not take any medications.  During evaluation Bradley Hamilton is observed sitting in the assessment room in no acute distress.  He is  disheveled, malodorous and is extremely thin.  His close are extremely baggy.  He is alert/oriented x 4, cooperative, and fairly attentive.  He is easily distracted.  He appears to be disorganized at times.  His mood is extremely labile and he is animated when he speaks.  He is easily agitated.  His speech is pressured and loud at times.  He denies any depression but then states he is struggling, "because every time I get back to the place I was born ". He denies SI/AVH.  He is paranoid and delusional.  He believes that he is going to be moving to [place] soon to be with his husband who has not seen in 5 years. His mother states this is a new delusion, he is not married and knows no one in [place]. He believes that his neighbors, one specifically  Filomena Jungling, is coming in to his house and taking pictures of him when he is sleeping.  In addition she is taking food out of his refrigerator and moving his items around the house. He believes he has a picture of her doing this.  He then states that he believes this stopped happening on Saturday but is inconsistent he states "maybe is stopped because my house is camouflaged".  He admits to approaching her and accusing her of coming into his home.  When asked if he is having any homicidal ideations he pauses for a few seconds and then states no.  He does not appear to be responding to internal/external stimuli.  Collateral Bradley Hamilton (mother) also  IVC petitioner.  States patient is currently not at his baseline.  She believes that he is a danger to himself.  States he is paranoia has increased over the past 2 weeks, he becomes easily agitated.  He has been approaching the neighbors because he believes that they are breaking into his house.  He stands in the street, yells and cusses even if no one is there.  He is been walking through the neighborhood with nothing on but his underwear, shirt and shoes.  The neighbors have called the police multiple times due to his  behavior.  He is constantly talking to himself or someone who is not there.  He has been pulling a chair up and down the road.  Reports he is drinking alcohol, 2-40 ounce beers daily.  Reports patient has a past diagnosis of schizophrenia.  Total Time spent with patient: 30 minutes  Musculoskeletal  Strength & Muscle Tone: within normal limits Gait & Station: normal Patient leans: N/A  Psychiatric Specialty Exam  Presentation General Appearance:  Disheveled  Eye Contact: Fleeting  Speech: Pressured  Speech Volume: Normal  Handedness: Right   Mood and Affect  Mood: Anxious; Irritable; Labile  Affect: Congruent; Labile; Full Range   Thought Process  Thought Processes: Coherent  Descriptions of Associations:Intact  Orientation:Full (Time, Place and Person)  Thought Content:Delusions; Paranoid Ideation  Diagnosis of Schizophrenia or Schizoaffective disorder in past: No   Hallucinations:Hallucinations: None  Ideas of Reference:None  Suicidal Thoughts:Suicidal Thoughts: No  Homicidal Thoughts:Homicidal Thoughts: No   Sensorium  Memory: Immediate Good; Recent Good; Remote Good  Judgment: Impaired  Insight: Lacking   Executive Functions  Concentration: Fair  Attention Span: Fair  Recall: [employer] of Knowledge: Fair  Language: Fair   Psychomotor Activity  Psychomotor Activity: Psychomotor Activity: Normal   Assets  Assets: Physical Health; Resilience; Social Support; Housing   Sleep  Sleep: Sleep: Good   Nutritional Assessment (For OBS and FBC admissions only) Has the patient had a weight loss or gain of 10 pounds or more in the last 3 months?: No Has the patient had a decrease in food intake/or appetite?: No Does the patient have dental problems?: No Does the patient have eating habits or behaviors that may be indicators of an eating disorder including binging or inducing vomiting?: No Has the patient recently lost  weight without trying?: 0 Has the patient been eating poorly because of a decreased appetite?: 0 Malnutrition Screening Tool Score: 0    Physical Exam Vitals and nursing note reviewed.  Constitutional:      General: He is not in acute distress.    Appearance: He is well-developed.  HENT:     Head: Normocephalic.  Eyes:     General:        Right eye: No discharge.        Left eye: No discharge.  Cardiovascular:     Rate and Rhythm: Normal rate.  Pulmonary:     Effort: Pulmonary effort is normal. No respiratory distress.  Musculoskeletal:        General: Normal range of motion.     Cervical back: Normal range of motion.  Skin:    General: Skin is warm and dry.  Neurological:     Mental Status: He is alert and oriented to person, place, and time.  Psychiatric:        Attention and Perception: Attention and perception normal.        Mood and Affect: Mood is anxious. Affect  is labile.        Speech: Speech is rapid and pressured.        Behavior: Behavior is cooperative.        Thought Content: Thought content is paranoid and delusional.        Cognition and Memory: Cognition normal.        Judgment: Judgment is impulsive.    Review of Systems  Constitutional: Negative.   HENT: Negative.    Eyes: Negative.   Respiratory: Negative.    Cardiovascular: Negative.   Musculoskeletal: Negative.   Skin: Negative.   Neurological: Negative.   Psychiatric/Behavioral:  Positive for substance abuse. The patient is nervous/anxious.     Blood pressure (!) 132/101, pulse (!) 107, temperature 98.7 F (37.1 C), temperature source Oral, resp. rate 18, SpO2 100 %. There is no height or weight on file to calculate BMI.  Past Psychiatric History: Major depressive disorder Unspecified psychosis  Is the patient at risk to self? Yes  Has the patient been a risk to self in the past 6 months? Yes .    Has the patient been a risk to self within the distant past? No   Is the patient a risk  to others? Yes   Has the patient been a risk to others in the past 6 months? Yes   Has the patient been a risk to others within the distant past? No   Past Medical History:  Past Medical History:  Diagnosis Date   Cough 05/20/2016   Gynecomastia 01/06/2018   Heart murmur 05/20/2016   HIV infection (HCC) 05/20/2016   Night sweats 05/20/2016   Smoker 05/20/2016     Family History: denies any pertinent family hx  Social History:  Unemployed Lives in single residence alone Single Nicotine daily Alcohol 2-3 x per week  Last Labs:  Admission on 07/22/2022, Discharged on 07/22/2022  Component Date Value Ref Range Status   Prolactin 07/22/2022 3.3 (L)  3.9 - 22.7 ng/mL Final   Comment: (NOTE) Performed At: [facility] [address] [place], [place] [id number] Jolene Schimke MD PI:[id number]    Hgb A1c MFr Bld 07/22/2022 5.1  4.8 - 5.6 % Final   Comment: (NOTE) Pre diabetes:          5.7%-6.4%  Diabetes:              >6.4%  Glycemic control for   <7.0% adults with diabetes    Mean Plasma Glucose 07/22/2022 99.67  mg/dL Final   Performed at [facility], [address] [place], [place] [address]   Sodium 07/22/2022 137  135 - 145 mmol/L Final   Potassium 07/22/2022 3.5  3.5 - 5.1 mmol/L Final   Chloride 07/22/2022 104  98 - 111 mmol/L Final   CO2 07/22/2022 24  22 - 32 mmol/L Final   Glucose, Bld 07/22/2022 105 (H)  70 - 99 mg/dL Final   Glucose reference range applies only to samples taken after fasting for at least 8 hours.   BUN 07/22/2022 18  6 - 20 mg/dL Final   Creatinine, Ser 07/22/2022 0.96  0.61 - 1.24 mg/dL Final   Calcium 02/12/3233 9.3  8.9 - 10.3 mg/dL Final   Total Protein 57/32/2025 9.5 (H)  6.5 - 8.1 g/dL Final   Albumin 42/70/6237 3.4 (L)  3.5 - 5.0 g/dL Final   AST 62/83/1517 40  15 - 41 U/L Final   ALT 07/22/2022 21  0 - 44 U/L Final  Alkaline Phosphatase 07/22/2022 218 (H)  38 - 126 U/L Final   Total Bilirubin 07/22/2022 0.4  0.3 - 1.2  mg/dL Final   GFR, Estimated 07/22/2022 >60  >60 mL/min Final   Comment: (NOTE) Calculated using the CKD-EPI Creatinine Equation (2021)    Anion gap 07/22/2022 9  5 - 15 Final   Performed at [facility], [address] [place], [place] [address]   WBC 07/22/2022 4.4  4.0 - 10.5 K/uL Final   RBC 07/22/2022 3.45 (L)  4.22 - 5.81 MIL/uL Final   Hemoglobin 07/22/2022 12.9 (L)  13.0 - 17.0 g/dL Final   HCT 47/82/9562 35.6 (L)  39.0 - 52.0 % Final   MCV 07/22/2022 103.2 (H)  80.0 - 100.0 fL Final   MCH 07/22/2022 37.4 (H)  26.0 - 34.0 pg Final   MCHC 07/22/2022 36.2 (H)  30.0 - 36.0 g/dL Final   CORRECTED FOR COLD AGGLUTININS   RDW 07/22/2022 15.7 (H)  11.5 - 15.5 % Final   Platelets 07/22/2022 252  150 - 400 K/uL Final   nRBC 07/22/2022 0.0  0.0 - 0.2 % Final   Neutrophils Relative % 07/22/2022 61  % Final   Neutro Abs 07/22/2022 2.7  1.7 - 7.7 K/uL Final   Lymphocytes Relative 07/22/2022 27  % Final   Lymphs Abs 07/22/2022 1.2  0.7 - 4.0 K/uL Final   Monocytes Relative 07/22/2022 11  % Final   Monocytes Absolute 07/22/2022 0.5  0.1 - 1.0 K/uL Final   Eosinophils Relative 07/22/2022 0  % Final   Eosinophils Absolute 07/22/2022 0.0  0.0 - 0.5 K/uL Final   Basophils Relative 07/22/2022 0  % Final   Basophils Absolute 07/22/2022 0.0  0.0 - 0.1 K/uL Final   Immature Granulocytes 07/22/2022 1  % Final   Abs Immature Granulocytes 07/22/2022 0.03  0.00 - 0.07 K/uL Final   Performed at [facility], [address], [place], [place] [address]   Magnesium 07/22/2022 1.9  1.7 - 2.4 mg/dL Final   Performed at [facility], [address], [place], [place] [address]   Cholesterol 07/22/2022 153  0 - 200 mg/dL Final   Triglycerides 96/29/5284 94  <150 mg/dL Final   HDL 13/24/4010 60  >40 mg/dL Final   Total CHOL/HDL Ratio 07/22/2022 2.6  RATIO Final   VLDL 07/22/2022 19  0 - 40 mg/dL Final   LDL Cholesterol 07/22/2022 74  0 - 99 mg/dL Final   Comment:        Total  Cholesterol/HDL:CHD Risk Coronary Heart Disease Risk Table                     Men   Women  1/2 Average Risk   3.4   3.3  Average Risk       5.0   4.4  2 X Average Risk   9.6   7.1  3 X Average Risk  23.4   11.0        Use the calculated Patient Ratio above and the CHD Risk Table to determine the patient's CHD Risk.        ATP III CLASSIFICATION (LDL):  <100     mg/dL   Optimal  272-536  mg/dL   Near or Above                    Optimal  130-159  mg/dL   Borderline  644-034  mg/dL  High  >190     mg/dL   Very High Performed at [facility], [address], [place], [place] [address]    TSH 07/22/2022 2.514  0.350 - 4.500 uIU/mL Final   Comment: Performed by a 3rd Generation assay with a functional sensitivity of <=0.01 uIU/mL. Performed at [facility], [address] [place], [place] [address]     Allergies: Patient has no known allergies.  Medications:  Facility Ordered Medications  Medication   acetaminophen (TYLENOL) tablet 650 mg   alum & mag hydroxide-simeth (MAALOX/MYLANTA) 200-200-20 MG/5ML suspension 30 mL   magnesium hydroxide (MILK OF MAGNESIA) suspension 30 mL   traZODone (DESYREL) tablet 50 mg   thiamine (VITAMIN B1) injection 100 mg   [START ON 08/06/2022] thiamine (VITAMIN B1) tablet 100 mg   multivitamin with minerals tablet 1 tablet   LORazepam (ATIVAN) tablet 1 mg   hydrOXYzine (ATARAX) tablet 25 mg   loperamide (IMODIUM) capsule 2-4 mg   ondansetron (ZOFRAN-ODT) disintegrating tablet 4 mg   PTA Medications  Medication Sig   bictegravir-emtricitabine-tenofovir AF (BIKTARVY) 50-200-25 MG TABS tablet Take 1 tablet by mouth daily.   FLUoxetine (PROZAC) 20 MG tablet Take 1 tablet (20 mg total) by mouth daily.   risperiDONE ER (UZEDY) 100 MG/0.28ML SUSY Inject 0.28 mLs (100 mg total) into the skin once for 1 dose.      Medical Decision Making  Patient presents with paranoia and delusional thought content.  He cannot be safely discharged at  this time.  He is recommended for inpatient psychiatric admission.  He will be admitted to the continuous assessment unit while awaiting inpatient psychiatric bed availability.    Recommendations  Based on my evaluation the patient does not appear to have an emergency medical condition.  Patient meets criteria for inpatient psychiatric admission.  Patient has been accepted to [facility] for inpatient psychiatric admission.   IVC will be upheld.-First examination complete  Medications: CIWA protocol with Ativan 1 mg p.o. every 6 hours for CIWA score greater than 10  Lab Orders         SARS Coronavirus 2 by RT PCR (hospital order, performed in [facility] hospital lab) *cepheid single result test* Anterior Nasal Swab         CBC with Differential/Platelet         Comprehensive metabolic panel         Urinalysis, Complete w Microscopic -Urine, Clean Catch         Ethanol         POCT Urine Drug Screen - (I-Screen)     Ardis Hughs, NP 08/05/22  2:31 PM

## 2022-08-05 NOTE — BH Assessment (Addendum)
Comprehensive Clinical Assessment (CCA) Note  08/05/2022 Bradley Hamilton [id number]  Disposition: Per Vernard Gambles, NP inpatient treatment is recommended.  [facility] to review.  Disposition SW to pursue appropriate inpatient options.  The patient demonstrates the following risk factors for suicide: Chronic risk factors for suicide include: psychiatric disorder of MDD and Schizophrenia and demographic factors (male, >51 y/o). Acute risk factors for suicide include: unemployment and social withdrawal/isolation. Protective factors for this patient include: positive social support, responsibility to others (children, family), coping skills, and hope for the future. Considering these factors, the overall suicide risk at this point appears to be low. Patient is appropriate for outpatient follow up, once stabilized.   Patient is a 52 year old male with a history of Schizophreniawho presents via GPD, under IVC, initiated by mother, to [facility] for assessment.   Per IVC " The respondent has been diagnosed with schizophrenia and MDD. The respondent was prescribed risperidone and fluoxetine which he does not take regularly. The respondent presents as paranoid and fears people are breaking into his apartment. The respondent reports hearing voices. The respondent is abusing alcohol. The respondent walks outside wearing only his underwear. The respondent has a history of committment in the past and is a danger to himself and others".   Upon assessment, patient is not sure why he was brought in.  Petition concerns were reviewed with patient.  He denied most, however later does admit to believing "Lahoma Crocker," his neighbor, is breaking into his home at night.  He denies seeing her in his home, however states there is evidence she has been there as items, including food, have gone missing.  During assessment, patient displays significant mood lability, appearing irritated at questions, yelling  responses and then beginning to laugh randomly while joking with provider and clinician.  Patient appears quite frustrated, as he discusses his neighbor entering his home.  He admits to IVC claim that he is approaching neighbors, stating he has spoken to his neighbor, Koleen Nimrod several times.  He states she "keeps denying" coming into his home.  Patient states he is Rx Risperdal and he takes "the shot," referring to [facility], as ordered.  It appears he was given his first dose by [facility] provider, Ephriam Knuckles, NP on 6/17.  He is due again on 7/17.   Patient shares he was seeing Eddie with [facility], however he became upset, shaking his head, mumbling some things about Eddie.  He does not plan to f/u with [facility] Snuffer, although he is agreeable with contuing [facility].    Patient lives alone currently, however states he is moving to [place] with is husband of 5 yrs.  He states his husband wants him to be "well" mentally before moving there, per patient report.  Per chart review, no mention of patient being married.  This may or may not be a delusional thought.  Patient is not working and states he applied for disability.  At this time, his mother supports him financially.  Patient denies SI, HI or AVH.  He does admit to paranoia, as in past presentations, believing his neighbor is coming into his home during day and night.  He insists this is happening and he claims ot have proof, mentioning a video he has.  He does not elaborate.   Per NP Effie Shy provider note: ?[name] (mother) also IVC petitioner, was interviewed.  States patient is currently not at his baseline.  She believes that he is a danger to himself.  States  he is paranoia has increased over the past 2 weeks, he becomes easily agitated.  He has been approaching the neighbors because he believes that they are breaking into his house.  He stands in the street, yells and cusses even if no one is there.  He is been walking through the neighborhood with nothing on but his  underwear, shirt and shoes.  The neighbors have called the police multiple times due to his behavior.  He is constantly talking to himself or someone who is not there.  He has been pulling a chair up and down the road.  Reports he is drinking alcohol, 2-40 ounce beers daily.  Reports patient has a past diagnosis of schizophrenia."   Chief Complaint:  Chief Complaint  Patient presents with   IVC   Visit Diagnosis: Schizophrenia, unspecified                                                 Vs.                             Major Depressive Disorder, recurrent Unspecified, w/ psychotic fx   CCA Screening, Triage and Referral (STR)  Patient Reported Information How did you hear about [place]? Legal System  What Is the Reason for Your Visit/Call Today? Pt presents to [facility] under IVC escorted by GPD. Per IVC " The respondent has been diagnosed with schizophrenia and MDD. The respondent was prescribed risperidone and fluotetine which he does not take regularly. The respondent presents as paranoid and fears people are breaking into his apartment. The respondent reports hearing voices. The respondent is abusing alcohol. The respondent walks outside wearing only his underwear. The respondent has a history of committment in the past and is a daner to himself and others". Pt denies drug or alcohol useparanoia, SI/HI and AVH.  How Long Has This Been Causing You Problems? <Week  What Do You Feel Would Help You the Most Today? Treatment for Depression or other mood problem   Have You Recently Had Any Thoughts About Hurting Yourself? No  Are You Planning to Commit Suicide/Harm Yourself At This time? No   Flowsheet Row ED from 08/05/2022 in [facility] ED from 07/22/2022 in [facility] Video Visit from 05/08/2021 in [facility]  C-SSRS RISK CATEGORY No Risk No Risk No Risk       Have you Recently Had Thoughts About Hurting  Someone Karolee Ohs? No  Are You Planning to Harm Someone at This Time? No  Explanation: N/A   Have You Used Any Alcohol or Drugs in the Past 24 Hours? No  What Did You Use and How Much? N/A   Do You Currently Have a Therapist/Psychiatrist? No  Name of Therapist/Psychiatrist: Name of Therapist/Psychiatrist: Patient was seeing Eddie with [facility] - no current providers   Have You Been Recently Discharged From Any Office Practice or Programs? No  Explanation of Discharge From Practice/Program: N/A     CCA Screening Triage Referral Assessment Type of Contact: Face-to-Face  Telemedicine Service Delivery:   Is this Initial or Reassessment?   Date Telepsych consult ordered in [facility]:    Time Telepsych consult ordered in [facility]:    Location of Assessment: [facility] Assessment Services  Provider Location: [facility] Assessment  Services   Collateral Involvement: Provider obtained collateral from petitioner, patient's mother.   Does Patient Have a Automotive engineer Guardian? No  Legal Guardian Contact Information: N/A  Copy of Legal Guardianship Form: -- (N/A)  Legal Guardian Notified of Arrival: -- (N/A)  Legal Guardian Notified of Pending Discharge: -- (N/A)  If Minor and Not Living with Parent(s), Who has Custody? N/A  Is CPS involved or ever been involved? Never  Is APS involved or ever been involved? Never   Patient Determined To Be At Risk for Harm To Self or Others Based on Review of Patient Reported Information or Presenting Complaint? No  Method: -- (N/A, no HI)  Availability of Means: -- (N/A, no HI)  Intent: -- (N/A, no HI)  Notification Required: -- (N/A, no HI)  Additional Information for Danger to Others Potential: -- (N/A, no HI)  Additional Comments for Danger to Others Potential: N/A, no HI  Are There Guns or Other Weapons in Your Home? No  Types of Guns/Weapons: N/A  Are These Weapons Safely Secured?                            -- (N/A)  Who Could Verify  You Are Able To Have These Secured: N/A, no HI  Do You Have any Outstanding Charges, Pending Court Dates, Parole/Probation? None  Contacted To Inform of Risk of Harm To Self or Others: -- (N/A, no HI)    Does Patient Present under Involuntary Commitment? Yes    [place] of Residence: [place]   Patient Currently Receiving the Following Services: Not Receiving Services   Determination of Need: Urgent (48 hours)   Options For Referral: Inpatient Hospitalization     CCA Biopsychosocial Patient Reported Schizophrenia/Schizoaffective Diagnosis in Past: No   Strengths: social, Runner, broadcasting/film/video,   Mental Health Symptoms Depression:   Fatigue; Difficulty Concentrating; Sleep (too much or little)   Duration of Depressive symptoms:    Mania:   Change in energy/activity; Increased Energy; Racing thoughts   Anxiety:    Worrying; Tension   Psychosis:   Hallucinations; Delusions (Pt answers his though outloud to encourage himself to encourage himself that everything is going to be okay)   Duration of Psychotic symptoms:  Duration of Psychotic Symptoms: Greater than six months   Trauma:   None   Obsessions:   N/A   Compulsions:   N/A   Inattention:   N/A   Hyperactivity/Impulsivity:   N/A   Oppositional/Defiant Behaviors:   N/A   Emotional Irregularity:   N/A   Other Mood/Personality Symptoms:  No data recorded   Mental Status Exam Appearance and self-care  Stature:   Average   Weight:   Thin   Clothing:   Casual   Grooming:   Normal   Cosmetic use:   None   Posture/gait:   Normal   Motor activity:   Restless   Sensorium  Attention:   Persistent; Vigilant   Concentration:   Variable   Orientation:   Object; Person; Place; Time   Recall/memory:   Normal   Affect and Mood  Affect:   Labile   Mood:   Irritable; Anxious   Relating  Eye contact:   Fleeting   Facial expression:   Tense   Attitude toward examiner:   Dramatic;  Irritable   Thought and Language  Speech flow:  Clear and Coherent   Thought content:   Appropriate to Mood and Circumstances   Preoccupation:  Ruminations   Hallucinations:   Auditory (Denies on assessment, reported to IVC petitioner, mother, that he was hearing voices.)   Organization:   Circumstantial; Passenger transport manager of Knowledge:   Fair   Intelligence:   Average   Abstraction:   Functional   Judgement:   Impaired   Reality Testing:   Distorted   Insight:   Gaps; Lacking   Decision Making:   Impulsive; Vacilates   Social Functioning  Social Maturity:   Responsible   Social Judgement:   Normal   Stress  Stressors:   Illness (mental illness, ongoing paranoia)   Coping Ability:   Exhausted   Skill Deficits:   Communication; Interpersonal; Self-control   Supports:   Family     Religion: Religion/Spirituality Are You A Religious Person?: Yes What is Your Religious Affiliation?:  (Islam) How Might This Affect Treatment?: N/A  Leisure/Recreation: Leisure / Recreation Do You Have Hobbies?: Yes Leisure and Hobbies: plays sax  Exercise/Diet: Exercise/Diet Do You Exercise?: No Have You Gained or Lost A Significant Amount of Weight in the Past Six Months?: No Do You Follow a Special Diet?: No Do You Have Any Trouble Sleeping?: No   CCA Employment/Education Employment/Work Situation: Employment / Work Situation Employment Situation: Unemployed Patient's Job has Been Impacted by Current Illness: No Has Patient ever Been in Equities trader?: No  Education: Education Is Patient Currently Attending School?: No Last Grade Completed: 18 Did You Product manager?: Yes What Type of College Degree Do you Have?: Master's Degree Did You Have An Individualized Education Program (IIEP): No Did You Have Any Difficulty At School?: No Patient's Education Has Been Impacted by Current Illness: No   CCA Family/Childhood  History Family and Relationship History: Family history Marital status: Married Number of Years Married: 5 What types of issues is patient dealing with in the relationship?: No concerns - Patient states he has been married, he has a wedding band on and states his husband lives in [place].  He states he plans to move there with him "once I am doing better" referring to his mental health. Additional relationship information: None Does patient have children?: No  Childhood History:  Childhood History By whom was/is the patient raised?: Both parents Did patient suffer any verbal/emotional/physical/sexual abuse as a child?: No Did patient suffer from severe childhood neglect?: No Has patient ever been sexually abused/assaulted/raped as an adolescent or adult?: No Was the patient ever a victim of a crime or a disaster?: No Witnessed domestic violence?: No Has patient been affected by domestic violence as an adult?: No       CCA Substance Use Alcohol/Drug Use: Alcohol / Drug Use Pain Medications: See MAR Prescriptions: See MAR Over the Counter: See MAR History of alcohol / drug use?: No history of alcohol / drug abuse Longest period of sobriety (when/how long): N/A - patient reports he drinks on occasion, mostly on weekends Withdrawal Symptoms: None                         ASAM's:  Six Dimensions of Multidimensional Assessment  Dimension 1:  Acute Intoxication and/or Withdrawal Potential:   Dimension 1:  Description of individual's past and current experiences of substance use and withdrawal: alchohol Hx of abuse currently drinks 3 tall boys in 1 weekend.  Dimension 2:  Biomedical Conditions and Complications:      Dimension 3:  Emotional, Behavioral, or Cognitive Conditions and Complications:  Dimension 4:  Readiness to Change:     Dimension 5:  Relapse, Continued use, or Continued Problem Potential:     Dimension 6:  Recovery/Living Environment:     ASAM Severity  Score:    ASAM Recommended Level of Treatment:     Substance use Disorder (SUD)    Recommendations for Services/Supports/Treatments:    Discharge Disposition:    DSM5 Diagnoses: Patient Active Problem List   Diagnosis Date Noted   Paranoia (psychosis) (HCC) 07/22/2022   Major depressive disorder, recurrent episode, moderate (HCC) 01/10/2020   Psychosis (HCC) 01/10/2020   Gynecomastia 01/06/2018   Elevated blood pressure reading 09/02/2017   HIV disease (HCC) 05/20/2016   Heart murmur 05/20/2016   Smoker 05/20/2016   Night sweats 05/20/2016   Cough 05/20/2016   ALLERGIC RHINITIS 11/17/2006     Referrals to Alternative Service(s): Referred to Alternative Service(s):   Place:   Date:   Time:    Referred to Alternative Service(s):   Place:   Date:   Time:    Referred to Alternative Service(s):   Place:   Date:   Time:    Referred to Alternative Service(s):   Place:   Date:   Time:     Yetta Glassman, [facility]

## 2022-08-05 NOTE — ED Notes (Signed)
Patient is transferring to [facility] at this time via [facility]. IVC paperwork, EMTALA, and other transfer paperwork sent with patient and provided to transport. Patient A&OX4. Denies SI,HI, and A/V/H. Calm and cooperative. All valuables/belongings sent with patient/transport. Patient in no current distress.

## 2022-08-05 NOTE — ED Provider Notes (Signed)
Case consulted with Dr. Jodi Mourning at [facility] emergency department.  Requested the MD review lab results patient's WBC 2.9 and RBC 3.37.  Patient has a history of HIV and currently does not follow-up with infectious disease.  He has been prescribed Biktarvy in the past but no longer takes medication.  Recommendation is for patient once his inpatient psychiatric admission is completed that he follow-up with infectious disease.

## 2022-08-05 NOTE — Progress Notes (Signed)
LCSW Progress Note  [id number]   HOLLISTER BEAZLEY  08/05/2022  3:32 PM  Description:   Inpatient Psychiatric Referral  Patient was recommended inpatient per Vernard Gambles, NP. There are no available beds at [facility], per [facility], RN. Patient was referred to the following out of network facilities:   [facility] Provider Address Phone Fax  [facility]  [address], [place] [address] [phone]  [facility]  [address] [place], [place] [address] [phone]  [facility] [address] [place]  [address], [place] [address] [phone]  [facility]  [address] [place], [place] [address] [phone]  [facility]  [address] [place] [address] [phone]  [facility]  [address] [place] [address] [phone]  [facility]  [address] [place] [address] [phone]  [facility]  [address] [place], [place] [address] [phone]  [facility]  [address] [place] [address] [phone]  [facility]  [address] [place] [address] [phone]  [facility]  [address], [place] [address] [phone]  [facility]  [address] [place] [address] [phone]  [facility]  [address] [place] [address] [phone]  [facility]  [address], [place] [address] [phone]  [facility]  [address], [place] [address] [phone]  [facility]   [address] [place] [address] [phone]  [facility]  [address], [place] [address] [phone]  [facility]  [address], [place] [address] [phone]  [facility]  [address] [place], [place] [address] [phone]  [facility]  [address], [place] [address] [phone]  [facility]  [address] [place] [address] [phone]  [facility]  [address] [place] [address] [phone]  [facility]  [address], [place] [address] [phone]  [facility]  [address] [place] [address] [phone]  [facility]  [address] [place] [address] [phone]  [facility]  [address] [place] [address] [phone]    Situation ongoing, CSW to continue following and update chart as more information becomes available.      Cathie Beams, [place]  08/05/2022 3:32 PM

## 2022-08-05 NOTE — ED Notes (Signed)
Pts mother Donn Pierini can be reached at [phone].

## 2022-08-05 NOTE — Discharge Instructions (Addendum)
Pt was accepted to [facility] TODAY 08/05/2022. Bed assignment: Main campus  Pt meets inpatient criteria per Vernard Gambles, NP  Attending Physician will be Loni Beckwith, MD  Report can be called to: [phone] (this is a pager, please leave call-back number when giving report)  Pt can arrive anytime today; bed is ready now  Care Team Notified: Vernard Gambles, NP

## 2022-08-05 NOTE — Progress Notes (Signed)
Pt was accepted to [facility] TODAY 08/05/2022. Bed assignment: Main campus  Pt meets inpatient criteria per Vernard Gambles, NP  Attending Physician will be Loni Beckwith, MD  Report can be called to: [phone] (this is a pager, please leave call-back number when giving report)  Pt can arrive anytime today; bed is ready now  Care Team Notified: Vernard Gambles, NP  Cathie Beams, LCSW  08/05/2022 4:03 PM

## 2022-08-05 NOTE — Progress Notes (Signed)
   08/05/22 1315  [facility] Triage Screening (Walk-ins at [facility] only)  How Did You Hear About [place]? Legal System  What Is the Reason for Your Visit/Call Today? Pt presents to [facility] under IVC escorted by [employer]. Per IVC " The respondent has been diagnosed with schizophrenia and MDD. The respondent was prescribed risperidone and fluotetine which he does not take regularly. The respondent presents as paranoid and fears people are breaking into his apartment. The respondent reports hearing voices. The respondent is abusing alcohol. The respondent walks outside wearing only his underwear. The respondent has a history of committment in the past and is a daner to himself and others". Pt denies drug or alcohol useparanoia, SI/HI and AVH.  How Long Has This Been Causing You Problems? <Week  Have You Recently Had Any Thoughts About Hurting Yourself? No  Are You Planning to Commit Suicide/Harm Yourself At This time? No  Have you Recently Had Thoughts About Hurting Someone Karolee Ohs? No  Are You Planning To Harm Someone At This Time? No  Are you currently experiencing any auditory, visual or other hallucinations? No  Have You Used Any Alcohol or Drugs in the Past 24 Hours? No  Do you have any current medical co-morbidities that require immediate attention? No  Clinician description of patient physical appearance/behavior: guarded, agitated  What Do You Feel Would Help You the Most Today? Treatment for Depression or other mood problem  If access to [facility] was not available, would you have sought care in the Emergency Department? No  Determination of Need Urgent (48 hours)  Options For Referral Inpatient Hospitalization

## 2022-08-05 NOTE — ED Notes (Signed)
Patient admitted to obs unit. Patient cooperative but labile with pressured loud speech. Circumstantial but A&Ox4. Patient oriented to unit and offered a meal. Patient med compliant with po meds. Patient denies any pain or discomfort at this with no s/s of current distress.

## 2022-08-20 ENCOUNTER — Ambulatory Visit (HOSPITAL_COMMUNITY): Payer: MEDICAID

## 2022-08-21 ENCOUNTER — Telehealth (HOSPITAL_COMMUNITY): Payer: Self-pay

## 2022-08-21 NOTE — Telephone Encounter (Signed)
PT's mother was notified that PT needs to establish care before being added to our shot clinic. Advised to bring him as a walk in for the established care. 08/21/22 [place]

## 2022-08-27 ENCOUNTER — Ambulatory Visit (INDEPENDENT_AMBULATORY_CARE_PROVIDER_SITE_OTHER): Payer: MEDICAID | Admitting: Physician Assistant

## 2022-08-27 ENCOUNTER — Encounter (HOSPITAL_COMMUNITY): Payer: Self-pay | Admitting: Physician Assistant

## 2022-08-27 VITALS — BP 149/115 | HR 97 | Temp 97.9°F | Ht 74.0 in | Wt 127.0 lb

## 2022-08-27 DIAGNOSIS — B20 Human immunodeficiency virus [HIV] disease: Secondary | ICD-10-CM

## 2022-08-27 DIAGNOSIS — F29 Unspecified psychosis not due to a substance or known physiological condition: Secondary | ICD-10-CM

## 2022-08-27 DIAGNOSIS — R030 Elevated blood-pressure reading, without diagnosis of hypertension: Secondary | ICD-10-CM

## 2022-08-27 MED ORDER — UZEDY 100 MG/0.28ML ~~LOC~~ SUSY
100.0000 mg | PREFILLED_SYRINGE | SUBCUTANEOUS | 11 refills | Status: DC
Start: 2022-08-27 — End: 2023-06-11

## 2022-08-27 MED ORDER — RISPERIDONE 2 MG PO TABS
2.0000 mg | ORAL_TABLET | Freq: Every day | ORAL | 0 refills | Status: DC
Start: 2022-08-27 — End: 2022-10-25
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
## 2022-09-02 ENCOUNTER — Encounter (HOSPITAL_COMMUNITY): Payer: Self-pay | Admitting: Emergency Medicine

## 2022-09-02 ENCOUNTER — Other Ambulatory Visit: Payer: Self-pay

## 2022-09-02 ENCOUNTER — Emergency Department (HOSPITAL_COMMUNITY)
Admission: EM | Admit: 2022-09-02 | Discharge: 2022-09-04 | Disposition: A | Discharge status: Other Acute Inpatient Hospital | Payer: MEDICAID | Attending: Emergency Medicine | Admitting: Emergency Medicine

## 2022-09-02 DIAGNOSIS — F29 Unspecified psychosis not due to a substance or known physiological condition: Secondary | ICD-10-CM | POA: Diagnosis not present

## 2022-09-02 DIAGNOSIS — F4320 Adjustment disorder, unspecified: Secondary | ICD-10-CM | POA: Diagnosis not present

## 2022-09-02 DIAGNOSIS — F331 Major depressive disorder, recurrent, moderate: Secondary | ICD-10-CM | POA: Diagnosis present

## 2022-09-02 DIAGNOSIS — Z21 Asymptomatic human immunodeficiency virus [HIV] infection status: Secondary | ICD-10-CM | POA: Diagnosis not present

## 2022-09-02 DIAGNOSIS — F4321 Adjustment disorder with depressed mood: Secondary | ICD-10-CM | POA: Diagnosis present

## 2022-09-02 DIAGNOSIS — F17210 Nicotine dependence, cigarettes, uncomplicated: Secondary | ICD-10-CM | POA: Diagnosis not present

## 2022-09-02 LAB — CBC
HCT: 33.8 % — ABNORMAL LOW (ref 39.0–52.0)
Hemoglobin: 11.3 g/dL — ABNORMAL LOW (ref 13.0–17.0)
MCH: 34.5 pg — ABNORMAL HIGH (ref 26.0–34.0)
MCHC: 33.4 g/dL (ref 30.0–36.0)
MCV: 103 fL — ABNORMAL HIGH (ref 80.0–100.0)
Platelets: 188 10*3/uL (ref 150–400)
RBC: 3.28 MIL/uL — ABNORMAL LOW (ref 4.22–5.81)
RDW: 14.3 % (ref 11.5–15.5)
WBC: 3.6 10*3/uL — ABNORMAL LOW (ref 4.0–10.5)
nRBC: 0 % (ref 0.0–0.2)

## 2022-09-02 LAB — COMPREHENSIVE METABOLIC PANEL
ALT: 22 U/L (ref 0–44)
AST: 36 U/L (ref 15–41)
Albumin: 3.5 g/dL (ref 3.5–5.0)
Alkaline Phosphatase: 134 U/L — ABNORMAL HIGH (ref 38–126)
Anion gap: 10 (ref 5–15)
BUN: 18 mg/dL (ref 6–20)
CO2: 20 mmol/L — ABNORMAL LOW (ref 22–32)
Calcium: 8.9 mg/dL (ref 8.9–10.3)
Chloride: 102 mmol/L (ref 98–111)
Creatinine, Ser: 0.99 mg/dL (ref 0.61–1.24)
GFR, Estimated: >60 mL/min (ref >60)
Glucose, Bld: 97 mg/dL (ref 70–99)
Potassium: 3.7 mmol/L (ref 3.5–5.1)
Sodium: 132 mmol/L — ABNORMAL LOW (ref 135–145)
Total Bilirubin: 0.6 mg/dL (ref 0.3–1.2)
Total Protein: 8.3 g/dL — ABNORMAL HIGH (ref 6.5–8.1)

## 2022-09-02 LAB — RAPID URINE DRUG SCREEN, HOSP PERFORMED
Amphetamines: NOT DETECTED
Barbiturates: NOT DETECTED
Benzodiazepines: NOT DETECTED
Cocaine: NOT DETECTED
Opiates: NOT DETECTED
Tetrahydrocannabinol: NOT DETECTED

## 2022-09-02 LAB — SALICYLATE LEVEL: Salicylate Lvl: <7 mg/dL — ABNORMAL LOW (ref 7.0–30.0)

## 2022-09-02 LAB — ETHANOL: Alcohol, Ethyl (B): 147 mg/dL — ABNORMAL HIGH (ref <10)

## 2022-09-02 LAB — ACETAMINOPHEN LEVEL: Acetaminophen (Tylenol), Serum: <10 ug/mL — ABNORMAL LOW (ref 10–30)

## 2022-09-02 MED ORDER — LORAZEPAM 2 MG/ML IJ SOLN
2.0000 mg | Freq: Once | INTRAMUSCULAR | Status: AC
  Administered 2022-09-02: 2 mg via INTRAVENOUS
  Filled 2022-09-02: qty 1

## 2022-09-02 MED ORDER — DIPHENHYDRAMINE HCL 50 MG/ML IJ SOLN
25.0000 mg | Freq: Once | INTRAMUSCULAR | Status: AC
  Administered 2022-09-02: 25 mg via INTRAVENOUS
  Filled 2022-09-02: qty 1

## 2022-09-02 MED ORDER — HALOPERIDOL LACTATE 5 MG/ML IJ SOLN
5.0000 mg | Freq: Once | INTRAMUSCULAR | Status: AC
  Administered 2022-09-02: 5 mg via INTRAMUSCULAR
  Filled 2022-09-02: qty 1

## 2022-09-02 MED ORDER — RISPERIDONE 2 MG PO TABS
2.0000 mg | ORAL_TABLET | Freq: Every day | ORAL | Status: DC
  Administered 2022-09-02 – 2022-09-03 (×2): 2 mg via ORAL
  Filled 2022-09-02: qty 4
  Filled 2022-09-02: qty 1
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
## 2022-09-02 NOTE — ED Notes (Signed)
Patient agitated and yelling in the room. EDP informed. Medication given. WIll continue to monitor.

## 2022-09-03 DIAGNOSIS — F4321 Adjustment disorder with depressed mood: Secondary | ICD-10-CM | POA: Diagnosis present

## 2022-09-03 DIAGNOSIS — F331 Major depressive disorder, recurrent, moderate: Secondary | ICD-10-CM

## 2022-09-03 MED ORDER — FLUOXETINE HCL 20 MG PO CAPS
20.0000 mg | ORAL_CAPSULE | Freq: Every day | ORAL | Status: DC
  Administered 2022-09-03: 20 mg via ORAL
  Filled 2022-09-03: qty 1

## 2022-09-03 MED ORDER — IBUPROFEN 800 MG PO TABS
800.0000 mg | ORAL_TABLET | Freq: Once | ORAL | Status: AC
  Administered 2022-09-03: 800 mg via ORAL
  Filled 2022-09-03: qty 1

## 2022-09-03 MED ORDER — ACETAMINOPHEN 325 MG PO TABS
650.0000 mg | ORAL_TABLET | Freq: Once | ORAL | Status: AC
  Administered 2022-09-03: 650 mg via ORAL
  Filled 2022-09-03: qty 2

## 2022-09-03 NOTE — ED Notes (Signed)
Patient continues to rest peacefully.

## 2022-09-03 NOTE — ED Notes (Signed)
Pt received two ham sandwiches and cranberry juice since dinner trays were no where to be found.

## 2022-09-03 NOTE — ED Notes (Signed)
Pt in bed .
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
## 2022-09-03 NOTE — BH Assessment (Signed)
TTS attempted to complete assessment. [name], RN, patient medicated and asleep, unable to arouse. TTS assessment will be completed at later time.

## 2022-09-03 NOTE — Progress Notes (Signed)
LCSW Progress Note  [id number]   Bradley Hamilton  09/03/2022  2:45 PM  Description:   Inpatient Psychiatric Referral  Patient was recommended inpatient per Dahlia Byes, NP. There are no available beds at [facility], per [facility] Rona Ravens, RN. Patient was referred to the following out of network facilities:   Destination  Service Provider Address Phone Fax  [facility]  [address], [place] [address] [phone]  [facility] [address]  [address] [place] [address] [phone]  [facility]  [address] [employer] [place] [address] [phone]  [facility]  [address] [place] [address] [phone]  [facility]  [phone] [place] [address] [phone]  [facility]  [address] [place], [place] [address] [phone]  [facility]  [address] [place] [address] [phone]  [facility]  [address] [place] [address] [phone]  [facility]  [address] [place] [address] [phone]  [facility]  [address], [place] [address] [phone]  [facility]  [address], [place] [address] [phone]  [facility]  [address], [place] [address] [phone]  [facility]  [address] [place] [address] [phone]  [facility]  [address] [place] [address] [phone]  [facility]  [address] [place] [address] [phone]  [facility]  [address],  [place] [address] [phone]  [facility]  [address] [place], [place] [address] [phone]  [facility]  [address], [place] [address] [phone]  [facility]  [address] [place] [address] [phone]  [facility]  [address] [place] [address] [phone]  [facility]  [address], [place] [address] [phone]  [facility]  [address], [place] [address] [phone]  [facility]  [address] [place], [place] [address] [phone]  [facility]  [address] [place] [address] [phone]  [facility]  [address] [place], [place] [address] [phone]    Situation ongoing, CSW to continue following and update chart as more information becomes available.      Cathie Beams, [place]  09/03/2022 2:45 PM

## 2022-09-03 NOTE — ED Notes (Signed)
Pt is asleep

## 2022-09-03 NOTE — Progress Notes (Signed)
Pt was accepted to [facility] 09/04/2022. Bed assignment: Main campus  Pt meets inpatient criteria per Dahlia Byes, NP  Attending Physician will be Loni Beckwith, MD  Report can be called to: [phone] (this is a pager, please leave call-back number when giving report)  Pt can arrive after 8 AM  Care Team Notified: Dahlia Byes, NP, Reyne Dumas, RN, and Rolm Bookbinder, RN  [place], [place]  09/03/2022 3:36 PM

## 2022-09-03 NOTE — ED Notes (Signed)
Pt given lunch tray.

## 2022-09-04 NOTE — ED Notes (Signed)
Writer called [phone] and left number so that call can be returned and writer give report to nurse. Pt supposed to be transferred to [facility] today

## 2022-09-04 NOTE — ED Notes (Signed)
Sheriff contacted for transport to [facility] advised he would call back when they are on the way

## 2022-09-05 ENCOUNTER — Ambulatory Visit (HOSPITAL_COMMUNITY): Payer: Medicaid Other | Admitting: Physician Assistant

## 2022-10-08 ENCOUNTER — Encounter (HOSPITAL_COMMUNITY): Payer: MEDICAID | Admitting: Physician Assistant

## 2022-10-24 ENCOUNTER — Encounter (HOSPITAL_COMMUNITY): Payer: Self-pay

## 2022-10-24 ENCOUNTER — Telehealth (HOSPITAL_COMMUNITY): Payer: MEDICAID | Admitting: Physician Assistant

## 2022-10-25 ENCOUNTER — Ambulatory Visit (INDEPENDENT_AMBULATORY_CARE_PROVIDER_SITE_OTHER): Payer: MEDICAID | Admitting: Physician Assistant

## 2022-10-25 DIAGNOSIS — F331 Major depressive disorder, recurrent, moderate: Secondary | ICD-10-CM | POA: Diagnosis not present

## 2022-10-25 DIAGNOSIS — F29 Unspecified psychosis not due to a substance or known physiological condition: Secondary | ICD-10-CM | POA: Diagnosis not present

## 2022-10-25 MED ORDER — RISPERIDONE 3 MG PO TABS: 3.0000 mg | ORAL_TABLET | Freq: Every day | ORAL | 1 refills | Status: DC

## 2022-10-25 MED ORDER — FLUOXETINE HCL 20 MG PO TABS
20.0000 mg | ORAL_TABLET | Freq: Every day | ORAL | 1 refills | Status: DC
Start: 2022-10-25 — End: 2023-10-23
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
## 2022-10-28 ENCOUNTER — Encounter (HOSPITAL_COMMUNITY): Payer: Self-pay | Admitting: Physician Assistant

## 2022-12-06 ENCOUNTER — Encounter (HOSPITAL_COMMUNITY): Payer: MEDICAID | Admitting: Physician Assistant

## 2022-12-11 ENCOUNTER — Encounter (HOSPITAL_COMMUNITY): Payer: Self-pay

## 2022-12-11 ENCOUNTER — Encounter (HOSPITAL_COMMUNITY): Payer: MEDICAID | Admitting: Physician Assistant

## 2023-01-31 ENCOUNTER — Other Ambulatory Visit (HOSPITAL_COMMUNITY): Payer: Self-pay | Admitting: Physician Assistant

## 2023-01-31 DIAGNOSIS — F29 Unspecified psychosis not due to a substance or known physiological condition: Secondary | ICD-10-CM

## 2023-02-10 ENCOUNTER — Telehealth (HOSPITAL_COMMUNITY): Payer: Self-pay

## 2023-02-10 NOTE — Telephone Encounter (Signed)
 Medication management - Fax received from patient's [facility] for a new Risperidone 30 mg order, last provided 10/25/22 + 1 refill and last filled on 02/01/23. Patient no showed for last scheduled appt 12/11/22 and needs to be rescheduled.

## 2023-02-20 NOTE — Telephone Encounter (Signed)
Message acknowledged and reviewed.

## 2023-06-08 ENCOUNTER — Emergency Department (HOSPITAL_COMMUNITY)
Admission: EM | Admit: 2023-06-08 | Discharge: 2023-06-09 | Disposition: A | Discharge status: Home / Self Care | Payer: MEDICAID | Attending: Emergency Medicine | Admitting: Emergency Medicine

## 2023-06-08 ENCOUNTER — Other Ambulatory Visit: Payer: Self-pay

## 2023-06-08 DIAGNOSIS — R0789 Other chest pain: Secondary | ICD-10-CM | POA: Insufficient documentation

## 2023-06-08 DIAGNOSIS — W1839XA Other fall on same level, initial encounter: Secondary | ICD-10-CM | POA: Diagnosis not present

## 2023-06-08 DIAGNOSIS — R7989 Other specified abnormal findings of blood chemistry: Secondary | ICD-10-CM

## 2023-06-08 DIAGNOSIS — S0083XA Contusion of other part of head, initial encounter: Secondary | ICD-10-CM | POA: Diagnosis not present

## 2023-06-08 DIAGNOSIS — R55 Syncope and collapse: Secondary | ICD-10-CM

## 2023-06-08 DIAGNOSIS — S80211A Abrasion, right knee, initial encounter: Secondary | ICD-10-CM | POA: Insufficient documentation

## 2023-06-08 DIAGNOSIS — Y9301 Activity, walking, marching and hiking: Secondary | ICD-10-CM | POA: Insufficient documentation

## 2023-06-08 DIAGNOSIS — S0990XA Unspecified injury of head, initial encounter: Secondary | ICD-10-CM | POA: Diagnosis present
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
## 2023-06-09 ENCOUNTER — Emergency Department (HOSPITAL_COMMUNITY): Payer: MEDICAID

## 2023-06-09 LAB — CBC WITH DIFFERENTIAL/PLATELET
Abs Immature Granulocytes: 0.01 10*3/uL (ref 0.00–0.07)
Basophils Absolute: 0 10*3/uL (ref 0.0–0.1)
Basophils Relative: 0 %
Eosinophils Absolute: 0 10*3/uL (ref 0.0–0.5)
Eosinophils Relative: 1 %
HCT: 37 % — ABNORMAL LOW (ref 39.0–52.0)
Hemoglobin: 12.7 g/dL — ABNORMAL LOW (ref 13.0–17.0)
Immature Granulocytes: 0 %
Lymphocytes Relative: 26 %
Lymphs Abs: 1.1 10*3/uL (ref 0.7–4.0)
MCH: 33.2 pg (ref 26.0–34.0)
MCHC: 34.3 g/dL (ref 30.0–36.0)
MCV: 96.6 fL (ref 80.0–100.0)
Monocytes Absolute: 0.5 10*3/uL (ref 0.1–1.0)
Monocytes Relative: 11 %
Neutro Abs: 2.6 10*3/uL (ref 1.7–7.7)
Neutrophils Relative %: 62 %
Platelets: 180 10*3/uL (ref 150–400)
RBC: 3.83 MIL/uL — ABNORMAL LOW (ref 4.22–5.81)
RDW: 13.8 % (ref 11.5–15.5)
WBC: 4.2 10*3/uL (ref 4.0–10.5)
nRBC: 0 % (ref 0.0–0.2)

## 2023-06-09 LAB — BASIC METABOLIC PANEL WITH GFR
Anion gap: 8 (ref 5–15)
BUN: 25 mg/dL — ABNORMAL HIGH (ref 6–20)
CO2: 25 mmol/L (ref 22–32)
Calcium: 9 mg/dL (ref 8.9–10.3)
Chloride: 104 mmol/L (ref 98–111)
Creatinine, Ser: 0.97 mg/dL (ref 0.61–1.24)
GFR, Estimated: >60 mL/min (ref >60)
Glucose, Bld: 129 mg/dL — ABNORMAL HIGH (ref 70–99)
Potassium: 3.8 mmol/L (ref 3.5–5.1)
Sodium: 137 mmol/L (ref 135–145)

## 2023-06-09 LAB — TROPONIN I (HIGH SENSITIVITY)
Troponin I (High Sensitivity): 22 ng/L — ABNORMAL HIGH (ref <18)
Troponin I (High Sensitivity): 22 ng/L — ABNORMAL HIGH (ref <18)
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
## 2023-06-09 NOTE — ED Notes (Signed)
 Patient seen exiting his room and out of the department with his belongings.
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
## 2023-06-09 NOTE — ED Notes (Signed)
 Pt stated to Nurse "this is not correct, the documentation is not correct, thius is not the right doctor, please get away from me." This nurse advised pt that he is discharged. Pt stated he "needs to put on his clothes and go outside." Nurse advised pt he has been dc.

## 2023-06-09 NOTE — ED Notes (Signed)
 Still waiting on urine sample, pt unable to urinate. Fluids have been given.

## 2023-06-09 NOTE — ED Notes (Signed)
 Pt returned to the ED and states that he thought he was done. RN explained that he has not yet been discharged and offered to remove IV just in case he needs to leave again; pt agreed. RN removed IV and dressed with gauze and paper tape. Pt currently in bed in NAD.

## 2023-06-11 ENCOUNTER — Encounter (HOSPITAL_COMMUNITY): Payer: Self-pay

## 2023-06-11 ENCOUNTER — Emergency Department (HOSPITAL_COMMUNITY)
Admission: EM | Admit: 2023-06-11 | Discharge: 2023-06-12 | Disposition: A | Discharge status: Other Acute Inpatient Hospital | Payer: MEDICAID | Attending: Emergency Medicine | Admitting: Emergency Medicine

## 2023-06-11 ENCOUNTER — Other Ambulatory Visit: Payer: Self-pay

## 2023-06-11 DIAGNOSIS — R440 Auditory hallucinations: Secondary | ICD-10-CM | POA: Diagnosis not present

## 2023-06-11 DIAGNOSIS — E871 Hypo-osmolality and hyponatremia: Secondary | ICD-10-CM | POA: Diagnosis not present

## 2023-06-11 DIAGNOSIS — Z21 Asymptomatic human immunodeficiency virus [HIV] infection status: Secondary | ICD-10-CM | POA: Diagnosis not present

## 2023-06-11 DIAGNOSIS — R748 Abnormal levels of other serum enzymes: Secondary | ICD-10-CM | POA: Insufficient documentation

## 2023-06-11 DIAGNOSIS — D696 Thrombocytopenia, unspecified: Secondary | ICD-10-CM | POA: Diagnosis not present

## 2023-06-11 DIAGNOSIS — F23 Brief psychotic disorder: Secondary | ICD-10-CM

## 2023-06-11 DIAGNOSIS — F29 Unspecified psychosis not due to a substance or known physiological condition: Secondary | ICD-10-CM | POA: Diagnosis present

## 2023-06-11 DIAGNOSIS — R441 Visual hallucinations: Secondary | ICD-10-CM | POA: Insufficient documentation

## 2023-06-11 DIAGNOSIS — D72819 Decreased white blood cell count, unspecified: Secondary | ICD-10-CM | POA: Insufficient documentation

## 2023-06-11 DIAGNOSIS — R030 Elevated blood-pressure reading, without diagnosis of hypertension: Secondary | ICD-10-CM

## 2023-06-11 LAB — CBC WITH DIFFERENTIAL/PLATELET
Abs Immature Granulocytes: 0.02 10*3/uL (ref 0.00–0.07)
Basophils Absolute: 0 10*3/uL (ref 0.0–0.1)
Basophils Relative: 0 %
Eosinophils Absolute: 0.1 10*3/uL (ref 0.0–0.5)
Eosinophils Relative: 1 %
HCT: 36.1 % — ABNORMAL LOW (ref 39.0–52.0)
Hemoglobin: 13.3 g/dL (ref 13.0–17.0)
Immature Granulocytes: 1 %
Lymphocytes Relative: 21 %
Lymphs Abs: 0.8 10*3/uL (ref 0.7–4.0)
MCH: 36.1 pg — ABNORMAL HIGH (ref 26.0–34.0)
MCHC: 36.8 g/dL — ABNORMAL HIGH (ref 30.0–36.0)
MCV: 98.1 fL (ref 80.0–100.0)
Monocytes Absolute: 0.3 10*3/uL (ref 0.1–1.0)
Monocytes Relative: 9 %
Neutro Abs: 2.6 10*3/uL (ref 1.7–7.7)
Neutrophils Relative %: 68 %
Platelets: 148 10*3/uL — ABNORMAL LOW (ref 150–400)
RBC: 3.68 MIL/uL — ABNORMAL LOW (ref 4.22–5.81)
RDW: 13.9 % (ref 11.5–15.5)
WBC: 3.8 10*3/uL — ABNORMAL LOW (ref 4.0–10.5)
nRBC: 0 % (ref 0.0–0.2)

## 2023-06-11 LAB — URINALYSIS, ROUTINE W REFLEX MICROSCOPIC
Bacteria, UA: NONE SEEN
Bilirubin Urine: NEGATIVE
Glucose, UA: NEGATIVE mg/dL
Hgb urine dipstick: NEGATIVE
Ketones, ur: NEGATIVE mg/dL
Leukocytes,Ua: NEGATIVE
Nitrite: NEGATIVE
Protein, ur: 100 mg/dL — AB
Specific Gravity, Urine: 1.013 (ref 1.005–1.030)
pH: 6 (ref 5.0–8.0)

## 2023-06-11 LAB — COMPREHENSIVE METABOLIC PANEL WITH GFR
ALT: 21 U/L (ref 0–44)
AST: 42 U/L — ABNORMAL HIGH (ref 15–41)
Albumin: 3.4 g/dL — ABNORMAL LOW (ref 3.5–5.0)
Alkaline Phosphatase: 160 U/L — ABNORMAL HIGH (ref 38–126)
Anion gap: 8 (ref 5–15)
BUN: 16 mg/dL (ref 6–20)
CO2: 24 mmol/L (ref 22–32)
Calcium: 8.8 mg/dL — ABNORMAL LOW (ref 8.9–10.3)
Chloride: 99 mmol/L (ref 98–111)
Creatinine, Ser: 0.82 mg/dL (ref 0.61–1.24)
GFR, Estimated: >60 mL/min (ref >60)
Glucose, Bld: 96 mg/dL (ref 70–99)
Potassium: 3.5 mmol/L (ref 3.5–5.1)
Sodium: 131 mmol/L — ABNORMAL LOW (ref 135–145)
Total Bilirubin: 0.8 mg/dL (ref 0.0–1.2)
Total Protein: 8.1 g/dL (ref 6.5–8.1)

## 2023-06-11 LAB — RAPID URINE DRUG SCREEN, HOSP PERFORMED
Amphetamines: NOT DETECTED
Barbiturates: NOT DETECTED
Benzodiazepines: POSITIVE — AB
Cocaine: NOT DETECTED
Opiates: NOT DETECTED
Tetrahydrocannabinol: NOT DETECTED

## 2023-06-11 LAB — ACETAMINOPHEN LEVEL: Acetaminophen (Tylenol), Serum: <10 ug/mL — ABNORMAL LOW (ref 10–30)

## 2023-06-11 LAB — ETHANOL: Alcohol, Ethyl (B): <15 mg/dL (ref <15)

## 2023-06-11 LAB — SALICYLATE LEVEL: Salicylate Lvl: <7 mg/dL — ABNORMAL LOW (ref 7.0–30.0)

## 2023-06-11 MED ORDER — ONDANSETRON HCL 4 MG PO TABS: 4.0000 mg | ORAL_TABLET | Freq: Three times a day (TID) | ORAL | Status: DC | PRN

## 2023-06-11 MED ORDER — ALUM & MAG HYDROXIDE-SIMETH 200-200-20 MG/5ML PO SUSP: 30.0000 mL | Freq: Four times a day (QID) | ORAL | Status: DC | PRN

## 2023-06-11 MED ORDER — FLUOXETINE HCL 20 MG PO CAPS
20.0000 mg | ORAL_CAPSULE | Freq: Every day | ORAL | Status: DC
  Administered 2023-06-12: 20 mg via ORAL
  Filled 2023-06-11 (×2): qty 1

## 2023-06-11 MED ORDER — HYDRALAZINE HCL 25 MG PO TABS
25.0000 mg | ORAL_TABLET | Freq: Once | ORAL | Status: AC
  Administered 2023-06-11: 25 mg via ORAL
  Filled 2023-06-11: qty 1

## 2023-06-11 MED ORDER — ACETAMINOPHEN 325 MG PO TABS: 650.0000 mg | ORAL_TABLET | ORAL | Status: DC | PRN

## 2023-06-11 MED ORDER — RISPERIDONE 0.5 MG PO TABS
3.0000 mg | ORAL_TABLET | Freq: Every day | ORAL | Status: DC
  Administered 2023-06-11: 3 mg via ORAL
  Filled 2023-06-11: qty 1

## 2023-06-11 MED ORDER — NICOTINE 14 MG/24HR TD PT24
14.0000 mg | MEDICATED_PATCH | Freq: Every day | TRANSDERMAL | Status: DC
Start: 2023-06-11 — End: 2023-06-12

## 2023-06-11 MED ORDER — ZIPRASIDONE MESYLATE 20 MG IM SOLR
10.0000 mg | Freq: Once | INTRAMUSCULAR | Status: AC
  Administered 2023-06-11: 10 mg via INTRAMUSCULAR
  Filled 2023-06-11: qty 20

## 2023-06-11 MED ORDER — AMLODIPINE BESYLATE 5 MG PO TABS
5.0000 mg | ORAL_TABLET | Freq: Once | ORAL | Status: AC
  Administered 2023-06-11: 5 mg via ORAL
  Filled 2023-06-11: qty 1

## 2023-06-11 NOTE — Progress Notes (Signed)
 Pt has been accepted to [facility] on 06/11/2023 Bed assignment: ADULT UNIT   Pt meets inpatient criteria per: Lady Pier NP  Attending Physician will be: Dr Johnney Nam   Report can be called to: [phone]  Pt can arrive after ASAP    Care Team Notified: Lady Pier NP, Majorie Scrape RN    [place] Georgianna Band LCSW-A   06/11/2023 11:09 AM

## 2023-06-11 NOTE — ED Provider Notes (Addendum)
 Emergency Medicine Observation Re-evaluation Note  Bradley Hamilton is a 53 y.o. male, seen on rounds today.  Pt initially presented to the ED for complaints of Psychiatric Evaluation Patient reportedly was running naked outside. Pt very limited/uncooperative historian - level 5 caveat. Pt noted in chart ot have history schizophrenia, and non-compliance w meds.  Pt attempting to run from ED. Periods agitated behavior, uncooperative w history/exam.   Physical Exam  BP (!) 166/109 (BP Location: Left Arm)   Pulse 84   Temp 97.8 F (36.6 C) (Oral)   Resp 16   Ht 1.88 m (6\' 2" )   Wt 68 kg   SpO2 98%   BMI 19.25 kg/m  Physical Exam General: awake, alert, appears in no acute physical distress.  Cardiac: regular rate.  Lungs: breathing comfortably. Psych:   patient poorly cooperative, indicates someone was out to get him, appears to have paranoid-type delusions (but again, very limited historian), will not answer questions about where he lives, who he lives with, family or friend supports, etc. Requests EMS transport to home, but unable to supply address or street. Changes subjects frequently.   ED Course / MDM    I have reviewed the labs performed to date as well as medications administered while in observation.  Recent changes in the last 24 hours include ED obs, reassessment.   Plan  Chart reviewed - hx schizophrenia/psychosis, current symptoms appear c/w prior when he required inpatient stay (was at [facility] last year).    Pt attempting to leave prior to [facility] eval, running out of ED room to lobby, was brought back to room. Pt with paranoid delusions. Geodon IM for symptom improvement.  IVC completed.   Attempted to reach family/parent for collateral information - no answer at phone numbers on chart.   Dispo per [facility] team.   The patient has been placed in psychiatric observation due to the need to provide a safe environment for the patient while obtaining psychiatric consultation and  evaluation, as well as ongoing medical and medication management to treat the patient's condition.  The patient has been placed under full IVC at this time.    Guadalupe Lee, MD 06/11/23 1041  Pt accepted to [facility], Dr Enriqueta Harvey.  Pt alert, content, no distress.  Pt currently appears stable for transfer/transport.       Guadalupe Lee, MD 06/11/23 [phone]

## 2023-06-11 NOTE — Progress Notes (Signed)
 LCSW Progress Note  [id number]   Bradley Hamilton  06/11/2023  10:41 AM  Description:   Inpatient Psychiatric Referral  Patient was recommended inpatient per Lady Pier NP. There are no available beds at [facility], per [facility] Kathryn Parish RN. Patient was referred to the following out of network facilities:   Destination  Service Provider Address Phone Fax  [facility] [address], [place] [address] [phone]  [facility] [address] [place], [place] [address] [phone]  [facility] [address] [place], [place] [address] [phone]  [facility] [address] [place] [address] [phone]  [facility] [address] [place] [address] [phone]  [facility] [address], [place] [address] [phone]  [facility] [address], [place] [address] [phone]  [facility] [place] [phone]  [facility] EFAX [address] [place], [place] [phone]  [facility] [address] [place], [place] [address] [phone]  [facility] [address], [place] [address] [phone]  [facility] [address] [place] [address] [phone]      Situation ongoing, CSW to continue following and update chart as more information becomes available.     [place] Jillisa Harris  MSW, LCSW  06/11/2023 10:41 AM

## 2023-06-11 NOTE — ED Notes (Signed)
 Pt provided with a ham sandwich and a ginger ale.

## 2023-06-11 NOTE — ED Provider Notes (Signed)
 [place] EMERGENCY DE[facility]MENT AT [facility] Provider Note   CSN: [id number] Arrival date & time: 06/11/23  1191     History  Chief Complaint  Patient presents with   Psychiatric Evaluation    Bradley Hamilton is a 53 y.o. male.  The history is provided by the EMS personnel and the patient. The history is limited by the condition of the patient (Psychiatric disorder).  He has history of psychosis, major depressive disorder, HIV infection and was brought in by EMS after he was found running around a pool while naked.  He was combative with EMS and they medicated him with haloperidol  and midazolam.  Patient states that he feels much better now and feels like he is back to his baseline.  He does remember being outside without close but cannot tell me why.  He denies visual and auditory hallucinations.  Denies any drug use.   Home Medications Prior to Admission medications   Medication Sig Start Date End Date Taking? Authorizing Provider  FLUoxetine  (PROZAC ) 20 MG tablet Take 1 tablet (20 mg total) by mouth daily. 10/25/22   Nwoko, Uchenna E, PA  risperiDONE  (RISPERDAL ) 3 MG tablet Take 1 tablet (3 mg total) by mouth at bedtime. 10/25/22   Nwoko, Uchenna E, PA  risperiDONE  ER (UZEDY ) 100 MG/0.28ML SUSY Inject 0.28 mLs (100 mg total) into the skin every 28 (twenty-eight) days. 08/27/22   Nwoko, Uchenna E, PA      Allergies    Patient has no known allergies.    Review of Systems   Review of Systems  Unable to perform ROS: Psychiatric disorder    Physical Exam Updated Vital Signs BP (!) 154/109   Pulse 74   Temp 98.5 F (36.9 C) (Oral)   Resp 14   Ht 6\' 2"  (1.88 m)   Wt 68 kg   SpO2 98%   BMI 19.25 kg/m  Physical Exam Vitals and nursing note reviewed.   53 year old male, resting comfortably and in no acute distress. Vital signs are significant for elevated blood pressure. Oxygen saturation is 100%, which is normal. Head is normocephalic and atraumatic. PERRLA,  EOMI.  Lungs are clear without rales, wheezes, or rhonchi. Chest is nontender. Heart has regular rate and rhythm without murmur. Abdomen is soft, flat, nontender . Skin is warm and dry without rash. Neurologic: Awake and alert, answers questions appropriately.  Moves all extremities equally.  ED Results / Procedures / Treatments   Labs (all labs ordered are listed, but only abnormal results are displayed) Labs Reviewed  COMPREHENSIVE METABOLIC PANEL WITH GFR - Abnormal; Notable for the following components:      Result Value   Sodium 131 (*)    Calcium 8.8 (*)    Albumin 3.4 (*)    AST 42 (*)    Alkaline Phosphatase 160 (*)    All other components within normal limits  SALICYLATE LEVEL - Abnormal; Notable for the following components:   Salicylate Lvl <7.0 (*)    All other components within normal limits  ACETAMINOPHEN  LEVEL - Abnormal; Notable for the following components:   Acetaminophen  (Tylenol ), Serum <10 (*)    All other components within normal limits  CBC WITH DIFFERENTIAL/PLATELET - Abnormal; Notable for the following components:   WBC 3.8 (*)    RBC 3.68 (*)    HCT 36.1 (*)    MCH 36.1 (*)    MCHC 36.8 (*)    Platelets 148 (*)    All other  components within normal limits  URINALYSIS, ROUTINE W REFLEX MICROSCOPIC - Abnormal; Notable for the following components:   Protein, ur 100 (*)    All other components within normal limits  RAPID URINE DRUG SCREEN, HOSP PERFORMED - Abnormal; Notable for the following components:   Benzodiazepines POSITIVE (*)    All other components within normal limits  ETHANOL   Procedures Procedures    Medications Ordered in ED Medications  nicotine  (NICODERM CQ  - dosed in mg/24 hours) patch 14 mg (has no administration in time range)  alum & mag hydroxide-simeth (MAALOX/MYLANTA) 200-200-20 MG/5ML suspension 30 mL (has no administration in time range)  ondansetron  (ZOFRAN ) tablet 4 mg (has no administration in time range)   acetaminophen  (TYLENOL ) tablet 650 mg (has no administration in time range)  FLUoxetine  (PROZAC ) tablet 20 mg (has no administration in time range)  risperiDONE  (RISPERDAL ) tablet 3 mg (has no administration in time range)    ED Course/ Medical Decision Making/ A&P                                 Medical Decision Making Amount and/or Complexity of Data Reviewed Labs: ordered.  Risk OTC drugs. Prescription drug management.   Episode of psychosis which appears to have been self-limited.  I have reviewed his past records, and the has had numerous ED visits for psychosis-most recently on 09/02/2022.  I do not have any records of hospitalization.  I have ordered screening labs and will observe the patient.  Once medically cleared, I will request TTS consultation.  I have reviewed laboratory tests, and my interpretation is drug screen positive for benzodiazepines (likely because of midazolam given by EMS), proteinuria which had been noticed previously, hyponatremia which is not felt to be clinically significant, mildly elevated alkaline phosphatase which had also been noted previously, mild leukopenia which had been noted previously, borderline thrombocytopenia which is not felt to be clinically significant, undetectable ethanol and acetaminophen  and salicylate.  He has continued to be cooperative in the emergency department.  He is medically cleared for psychiatric evaluation.  Patient told me that the reason he had his episode tonight was that his mother had kicked him out and was not giving him access to his risperidone  which he takes at bedtime.  TTS consultation is still pending.  Final Clinical Impression(s) / ED Diagnoses Final diagnoses:  Acute psychosis (HCC)  Hyponatremia  Elevated alkaline phosphatase level  Leukopenia, unspecified type  Thrombocytopenia [facility])    Rx / DC Orders ED Discharge Orders     None         Alissa April, MD 06/11/23 [phone]

## 2023-06-11 NOTE — ED Notes (Addendum)
 Pt left room and went outside, he was approaching traffic into the hospital drive way to main lobby, Jonell Neptune Chief Technology Officer) was able to dis sway pt from entering traffic, pt laid on grass, Don remained with him until Clinical research associate arrived, Pt was compliant with getting up, taking writers and Don's hand walking back into the hospital. He became irritated and combative in hallway, due to safety of pt and staff, pt was placed in a w/c and with assistance of security he was brought back to Room 11. Dr Moses Arenas  aware, after his evaluation, pt is IVC'd.

## 2023-06-11 NOTE — ED Notes (Signed)
 Patient has history of hypertension and non medication compliant.  Patient blood pressure is elevated .  Provider notified. Medication was ordered.  Unsure if and when Bradley Hamilton going to take patient.  Social worker notified and asked to follow up

## 2023-06-11 NOTE — ED Notes (Signed)
 Patient has been calm and cooperative. Patient resting comfortably,

## 2023-06-11 NOTE — ED Notes (Signed)
 Patient given graham crackers for snack. Patient didn't have any other needs expressed at this time.

## 2023-06-11 NOTE — ED Notes (Signed)
 Sandwich and milk was given to patient

## 2023-06-11 NOTE — ED Triage Notes (Signed)
 Pt BIBA after being picked up from a neighborhood [place]pt Complex pool running around naked. Per medics Pt would not allow them to get close to him and as they approached he became combative. Medics placed Pt in 4pt restraints and medicated him as well with Haldol  and Versed.  90-180/120-100%RA   5mg  Haldol  5mg  Versed

## 2023-06-11 NOTE — ED Notes (Signed)
 Pt has been changed out into blue scrubs since [place] scrubs weren't available. Pt has one bag with all his belongings.

## 2023-06-11 NOTE — ED Notes (Signed)
PT ambulated to restroom.

## 2023-06-11 NOTE — Consult Note (Signed)
 [facility] Psychiatric Consult Initial  Patient Name: .Bradley Hamilton  MRN: [id number]  DOB: [birth year]  Consult Order details:  Orders (From admission, onward)     Start     Ordered   06/11/23 0556  CONSULT TO CALL ACT TEAM       Ordering Provider: Alissa April, MD  Provider:  (Not yet assigned)  Question:  Reason for Consult?  Answer:  Psych consult   06/11/23 0556             Mode of Visit: In person    Psychiatry Consult Evaluation  Service Date: Jun 11, 2023 LOS:  LOS: 0 days  Chief Complaint "I feel great"  Primary Psychiatric Diagnoses  Psychosis  Assessment  Bradley Hamilton is a 53 y.o. male admitted: Presented to the [facility] 06/11/2023  3:58 AM brought in by ambulance after being picked up for running around his apartment complex naked. He carries the psychiatric diagnoses of schizophrenia, MDD and alcohol use disorder and has a past medical history of  HIV, HBP and a heart murmur.   His current presentation of calm cooperation is most consistent with baseline status of patient. He meets criteria for inpatient treatment based on being a danger to himself after attempting to elope and running into traffic.  Current outpatient psychotropic medications include risperdal  and prozac  and historically he has had a positive response to these medications. He was not compliant with medications prior to admission as evidenced by patient report that he is out of his medications. On initial examination, patient is dressed, cooperative and compliant. Please see plan below for detailed recommendations.   Diagnoses:  Active Hospital problems: Principal Problem:   Psychosis (HCC)    Plan   ## Psychiatric Medication Recommendations:  --Risperdal  3mg  PO Q HS --Prozac  20mg  PO Q day  ## Medical Decision Making Capacity: Not specifically addressed in this encounter  ## Further Work-up:  -- most recent EKG on 06/08/2023 had QtC of 439 -- Pertinent labwork reviewed earlier this  admission includes: CBC, CMP, UA, alcohol, salicylate and UDS   ## Disposition:-- We recommend inpatient psychiatric hospitalization when medically cleared. Patient is under voluntary admission status at this time; please IVC if attempts to leave hospital.  ## Behavioral / Environmental: - No specific recommendations at this time.     ## Safety and Observation Level:  - Based on my clinical evaluation, I estimate the patient to be at no risk of self harm in the current setting. - At this time, we recommend  routine. This decision is based on my review of the chart including patient's history and current presentation, interview of the patient, mental status examination, and consideration of suicide risk including evaluating suicidal ideation, plan, intent, suicidal or self-harm behaviors, risk factors, and protective factors. This judgment is based on our ability to directly address suicide risk, implement suicide prevention strategies, and develop a safety plan while the patient is in the clinical setting. Please contact our team if there is a concern that risk level has changed.  CSSR Risk Category:C-SSRS RISK CATEGORY: No Risk  Suicide Risk Assessment: Patient has following modifiable risk factors for suicide: medication noncompliance, which we are addressing by recommending outpatient follow up. Patient has following non-modifiable or demographic risk factors for suicide: male gender and psychiatric hospitalization Patient has the following protective factors against suicide: Access to outpatient mental health care and Supportive family  Thank you for this consult request. Recommendations have been communicated to the primary team.  We will continue to follow at this time.   Jeraline Moment, NP       History of Present Illness  Relevant Aspects of Hospital ED Course:  Admitted on 06/11/2023 for brought in by ambulance after being picked up for running around his apartment complex naked. He  carries the psychiatric diagnoses of schizophrenia, MDD and alcohol use disorder and has a past medical history of  HIV, HBP and a heart murmur.   Patient Report:  Bradley Hamilton, is seen face to face by this provider, consulted with Dr. Deborah Falling; and chart reviewed on 06/11/23.  On evaluation Bradley Hamilton reports "I feel great."  He says he feels much better and "at my baseline."  Patient states that he didn't take his medication because he ran out.  He says that he needs a refill and he can follow up with his outpatient provider.  He is calm and cooperative throughout assessment.  Patient states that he is experiencing auditory and visual hallucinations, some of which are scary and some of which are friendly.  He says they will get better with medication.  During evaluation Bradley Hamilton is dressed and seated on a stretcher in no acute distress.  He is alert & oriented x 4, calm, cooperative and attentive for this assessment.  His mood is euthymic with congruent affect.  He has normal speech, and behavior.  Objectively there is evidence of psychosis/mania or delusional thinking. Pt does not appear to be responding to internal or external stimuli.  Patient is able to converse coherently, goal directed thoughts, no distractibility, or pre-occupation.  He denies suicidal/self-harm/homicidal ideation and paranoia; patient endorses psychosis.  Patient answered question appropriately.    As this assessment was being prepared, nursing staff alerted provider that the patient attempted to elope and was found running into traffic.  Patient will be referred for inpatient treatment at this time.     Psych ROS:  Depression: endorses Anxiety:  endorses Mania (lifetime and current): denies Psychosis: (lifetime and current): endorses  Review of Systems  Psychiatric/Behavioral:  Positive for hallucinations.   All other systems reviewed and are negative.    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 Living Situation: alone in an apartment Spiritual Hx: "pastor" in [employer] Access to weapons/lethal means: denies   Substance History Alcohol: denies today  Tobacco: endorses 1 pack a day since he was 53 years old Illicit drugs: denies Prescription drug abuse: denies Rehab hx: denies  Exam Findings  Physical Exam:  Vital Signs:  Temp:  [97.8 F (36.6 C)-98.5 F (36.9 C)] 97.8 F (36.6 C) (05/07 0753) Pulse Rate:  [74-84] 84 (05/07 0753) Resp:  [14-18] 16 (05/07 0753) BP: (154-177)/(109-120) 166/109 (05/07 0753) SpO2:  [98 %-100 %] 98 % (05/07 0753) Weight:  [68 kg] 68 kg (05/07 0358) Blood pressure (!) 166/109, pulse 84, temperature 97.8 F (36.6 C), temperature source Oral, resp. rate 16, height 6\' 2"  (1.88 m), weight 68 kg, SpO2 98%. Body mass index is 19.25 kg/m.  Physical Exam Vitals and nursing note reviewed.  Eyes:  Pupils: Pupils are equal, round, and reactive to light.  Pulmonary:     Effort: Pulmonary effort is normal.  Skin:    General: Skin is dry.  Neurological:     Mental Status: He is alert and oriented to person, place, and time.  Psychiatric:        Attention and Perception: He perceives auditory and visual hallucinations.        Mood and Affect: Mood and affect normal.        Speech: Speech normal.        Behavior: Behavior is cooperative.        Cognition and Memory: Memory normal.        Judgment: Judgment is impulsive.     Mental Status  Exam: General Appearance: Casual  Orientation:  Full (Time, Place, and Person)  Memory:  Immediate;   Fair Recent;   Poor Remote;   Poor  Concentration:  Concentration: Fair  Recall:  Fair  Attention  Fair  Eye Contact:  Good  Speech:  Clear and Coherent  Language:  Fair  Volume:  Normal  Mood: euthymic  Affect:  Congruent  Thought Process:  Goal Directed  Thought Content:  Hallucinations: Auditory Visual  Suicidal Thoughts:  No  Homicidal Thoughts:  No  Judgement:  Impaired  Insight:  Shallow  Psychomotor Activity:  Restlessness  Akathisia:  No  Fund of Knowledge:  Fair      Assets:  Manufacturing systems engineer Housing Leisure Time Social Support  Cognition:  WNL  ADL's:  Intact  AIMS (if indicated):        Other History   These have been pulled in through the EMR, reviewed, and updated if appropriate.  Family History:  The patient's family history is not on file.  Medical History: Past Medical History:  Diagnosis Date  . Cough 05/20/2016  . Gynecomastia 01/06/2018  . Heart murmur 05/20/2016  . HIV infection (HCC) 05/20/2016  . Night sweats 05/20/2016  . Smoker 05/20/2016    Surgical History: History reviewed. No pertinent surgical history.   Medications:   Current Facility-Administered Medications:  .  acetaminophen  (TYLENOL ) tablet 650 mg, 650 mg, Oral, Q4H PRN, Alissa April, MD .  alum & mag hydroxide-simeth (MAALOX/MYLANTA) 200-200-20 MG/5ML suspension 30 mL, 30 mL, Oral, Q6H PRN, Alissa April, MD .  FLUoxetine  (PROZAC ) capsule 20 mg, 20 mg, Oral, Daily, Alissa April, MD .  nicotine  (NICODERM CQ  - dosed in mg/24 hours) patch 14 mg, 14 mg, Transdermal, Daily, Alissa April, MD .  ondansetron  (ZOFRAN ) tablet 4 mg, 4 mg, Oral, Q8H PRN, Alissa April, MD .  risperiDONE  (RISPERDAL ) tablet 3 mg, 3 mg, Oral, QHS, Alissa April, MD  Current Outpatient Medications:  .  FLUoxetine  (PROZAC ) 20 MG tablet, Take 1 tablet (20 mg total) by mouth daily. (Patient not taking:  Reported on 06/11/2023), Disp: 30 tablet, Rfl: 1 .  risperiDONE  (RISPERDAL ) 3 MG tablet, Take 1 tablet (3 mg total) by mouth at bedtime. (Patient not taking: Reported on 06/11/2023), Disp: 30 tablet, Rfl: 1  Allergies: No Known Allergies  Schae Cando A Elbridge Magowan, NP

## 2023-06-12 NOTE — ED Notes (Signed)
 Breakfast tray given.

## 2023-06-12 NOTE — ED Notes (Signed)
 Pt introduced to room. Pt laying in bed. No complaints

## 2023-06-12 NOTE — ED Notes (Signed)
Pt talking and laughing to self in room.

## 2023-10-21 ENCOUNTER — Encounter (HOSPITAL_COMMUNITY): Payer: MEDICAID | Admitting: Physician Assistant

## 2023-10-21 ENCOUNTER — Encounter (HOSPITAL_COMMUNITY): Payer: Self-pay

## 2023-10-23 ENCOUNTER — Ambulatory Visit (INDEPENDENT_AMBULATORY_CARE_PROVIDER_SITE_OTHER): Payer: MEDICAID | Admitting: Physician Assistant

## 2023-10-23 VITALS — BP 157/113 | HR 70 | Temp 97.9°F | Ht 74.0 in | Wt 129.2 lb

## 2023-10-23 DIAGNOSIS — F29 Unspecified psychosis not due to a substance or known physiological condition: Secondary | ICD-10-CM | POA: Diagnosis not present

## 2023-10-23 DIAGNOSIS — F331 Major depressive disorder, recurrent, moderate: Secondary | ICD-10-CM

## 2023-10-23 MED ORDER — RISPERIDONE 1 MG PO TABS: ORAL_TABLET | ORAL | 0 refills | Status: AC

## 2023-10-23 MED ORDER — FLUOXETINE HCL 20 MG PO TABS: 20.0000 mg | ORAL_TABLET | Freq: Every day | ORAL | 2 refills | Status: AC

## 2023-10-23 MED ORDER — RISPERIDONE 3 MG PO TABS: 3.0000 mg | ORAL_TABLET | Freq: Every day | ORAL | 2 refills | Status: AC

## 2023-10-23 MED ORDER — FLUOXETINE HCL 10 MG PO CAPS: ORAL_CAPSULE | ORAL | 0 refills | Status: AC

## 2023-10-23 NOTE — Progress Notes (Signed)
 BH MD/PA/NP OP Progress Note  10/23/2023 8:23 AM [name]  MRN:  [id number]  Chief Complaint:  Chief Complaint  Patient presents with   Establish Care   Medication Management   HPI:   Bradley Hamilton is a 53 year old male with a past psychiatric history significant for psychosis (unspecified psychosis) and major depressive disorder who presents to [facility] for follow-up and medication management.  Patient was last seen by this provider on 10/25/2022.  During his last encounter, patient was being managed on the following psychiatric medications:  Risperidone  3 mg at bedtime. Fluoxetine  20 mg daily  Patient reports that he last took his medication a month ago.  He reports that he stopped taking his medication after having to leave his previous place of residence.  Patient endorses anxiety, depression, and the inability to relax.  Patient continues to endorse depression and rates his depression a 10 out of 10 with 10 being most severe.  Patient endorses depressive episodes every day.  Patient endorses the following depressive symptoms: feelings of sadness, decreased energy, irritability, and hopelessness.  Patient denies lack of motivation, decreased concentration, or feelings of guilt/worthlessness.  Patient also endorses elevated anxiety but denies any contributing factors.  Patient denies any changes in his behavior but continues to endorse paranoia.  Patient endorses visual hallucinations stating that he saw/witnessed cracked glass.  A PHQ-9 screening was performed with the patient scoring a 15.  A GAD-7 screen was also performed with the patient scoring a 21.  Patient is alert and oriented x 4, calm, cooperative, and fully engaged in conversation during the encounter.  Patient reports that he is all out of sorts and has been having trouble with concentrating.  Patient exhibits depressed mood with congruent affect.  Patient denies suicidal or  homicidal ideations.  He further denies auditory or visual hallucinations and does not appear to be responding to internal/external stimuli.  Patient endorses auditory or visual hallucinations but does not appear to be responding to internal/external stimuli.  Patient endorses poor sleep and receives on average 2 hours of sleep per night.  Patient endorses fair appetite and eats on average 3 meals per day.  Patient endorses alcohol consumption on occasion and drinks a beer every 3 days.  Patient endorses tobacco use and smokes on average a pack per day.  Patient denies illicit drug use.  Visit Diagnosis:    ICD-10-CM   1. Psychosis, unspecified psychosis type (HCC)  F29 risperiDONE  (RISPERDAL ) 3 MG tablet    risperiDONE  (RISPERDAL ) 1 MG tablet    2. Major depressive disorder, recurrent episode, moderate (HCC)  F33.1 FLUoxetine  (PROZAC ) 20 MG tablet    FLUoxetine  (PROZAC ) 10 MG capsule     Past Psychiatric History:  Patient has a past psychiatric history significant for psychosis (unspecified). Patient informed provider that he has a past psychiatric history significant for schizophrenia; however, per chart review, provider is unable to locate past history of schizophrenia. Patient was last seen by this provider on 05/08/2021.   Past Medical History:  Past Medical History:  Diagnosis Date   Cough 05/20/2016   Gynecomastia 01/06/2018   Heart murmur 05/20/2016   HIV infection (HCC) 05/20/2016   Night sweats 05/20/2016   Smoker 05/20/2016   History reviewed. No pertinent surgical history.  Family Psychiatric History:  Patient denies a family history of psychiatric illness   Family history of suicide attempts: Patient denies Family history of homicide attempts: Patient denies Family history of substance abuse:  Patient reports that his father (biological) abused alcohol  Family History: History reviewed. No pertinent family history.  Social History:  Social History   Socioeconomic History    Marital status: Single    Spouse name: Not on file   Number of children: Not on file   Years of education: Not on file   Highest education level: Not on file  Occupational History   Not on file  Tobacco Use   Smoking status: Every Day    Current packs/day: 1.00    Types: Cigarettes   Smokeless tobacco: Never  Vaping Use   Vaping status: Never Used  Substance and Sexual Activity   Alcohol use: Yes    Alcohol/week: 3.0 standard drinks of alcohol    Types: 3 Cans of beer per week   Drug use: No   Sexual activity: Not Currently    Comment: declined condoms  Other Topics Concern   Not on file  Social History Narrative   Not on file   Social Drivers of Health   Financial Resource Strain: Not on File (12/18/2022)   Received from [employer]    Financial Resource Strain: 0  Food Insecurity: Not on File (12/18/2022)   Received from [facility] Insecurity    Food: 0  Transportation Needs: Not on File (12/18/2022)   Received from [employer] Needs    Transportation: 0  Physical Activity: Not on File (12/18/2022)   Received from [facility]   Physical Activity    Physical Activity: 0  Stress: Not on File (12/18/2022)   Received from [facility]   Stress    Stress: 0  Social Connections: Not on File (12/18/2022)   Received from [facility]   Social Connections    Connectedness: 0    Allergies: No Known Allergies  Metabolic Disorder Labs: Lab Results  Component Value Date   HGBA1C 5.1 07/22/2022   MPG 99.67 07/22/2022   Lab Results  Component Value Date   PROLACTIN 3.3 (L) 07/22/2022   PROLACTIN 19.6 (H) 01/06/2018   Lab Results  Component Value Date   CHOL 153 07/22/2022   TRIG 94 07/22/2022   HDL 60 07/22/2022   CHOLHDL 2.6 07/22/2022   VLDL 19 07/22/2022   LDLCALC 74 07/22/2022   LDLCALC 124 (H) 01/06/2018   Lab Results  Component Value Date   TSH 2.514 07/22/2022   TSH 2.03 01/06/2018    Therapeutic Level Labs: No results found  for: LITHIUM No results found for: VALPROATE No results found for: CBMZ  Current Medications: Current Outpatient Medications  Medication Sig Dispense Refill   FLUoxetine  (PROZAC ) 10 MG capsule Patient to take fluoxetine  10 mg for 6 days then continue taking 20 mg daily. 6 capsule 0   risperiDONE  (RISPERDAL ) 1 MG tablet Take 1 tablet (1 mg total) by mouth at bedtime for 6 days, THEN 2 tablets (2 mg total) at bedtime for 6 days. 18 tablet 0   FLUoxetine  (PROZAC ) 20 MG tablet Take 1 tablet (20 mg total) by mouth daily. 30 tablet 2   risperiDONE  (RISPERDAL ) 3 MG tablet Take 1 tablet (3 mg total) by mouth at bedtime. 30 tablet 2   No current facility-administered medications for this visit.     Musculoskeletal: Strength & Muscle Tone: within normal limits Gait & Station: normal Patient leans: N/A  Psychiatric Specialty Exam: Review of Systems  Psychiatric/Behavioral:  Positive for behavioral problems, dysphoric mood, hallucinations and sleep disturbance. Negative for decreased concentration, self-injury  and suicidal ideas. The patient is nervous/anxious. The patient is not hyperactive.     Blood pressure (!) 157/113, pulse 70, temperature 97.9 F (36.6 C), temperature source Oral, height 6' 2 (1.88 m), weight 129 lb 3.2 oz (58.6 kg), SpO2 100%.Body mass index is 16.59 kg/m.  General Appearance: Casual  Eye Contact:  Good  Speech:  Clear and Coherent and Normal Rate  Volume:  Normal  Mood:  Anxious and Depressed  Affect:  Congruent  Thought Process:  Coherent, Goal Directed, and Descriptions of Associations: Intact  Orientation:  Full (Time, Place, and Person)  Thought Content: Hallucinations: Visual and Paranoid Ideation   Suicidal Thoughts:  No  Homicidal Thoughts:  No  Memory:  Immediate;   Good Recent;   Fair Remote;   Fair  Judgement:  Impaired  Insight:  Lacking  Psychomotor Activity:  Normal  Concentration:  Concentration: Good and Attention Span: Good  Recall:   [employer] of Knowledge: Fair  Language: Good  Akathisia:  No  Handed:  Right  AIMS (if indicated): not done  Assets:  Communication Skills Desire for Improvement Housing Social Support  ADL's:  Impaired  Cognition: WNL  Sleep:  Poor   Screenings: GAD-7    Flowsheet Row Clinical Support from 10/23/2023 in [facility] Clinical Support from 10/25/2022 in [facility] Office Visit from 08/27/2022 in [facility] Video Visit from 05/08/2021 in [facility] Clinical Support from 10/26/2020 in [facility]  Total GAD-7 Score 21 16 15 5 2    PHQ2-9    Flowsheet Row Clinical Support from 10/23/2023 in [facility] Clinical Support from 10/25/2022 in [facility] Office Visit from 08/27/2022 in [facility] Video Visit from 05/08/2021 in [facility] Clinical Support from 10/26/2020 in [facility]  PHQ-2 Total Score 3 1 0 1 0  PHQ-9 Total Score 15 -- -- -- --   Flowsheet Row Clinical Support from 10/23/2023 in [facility] ED from 06/11/2023 in [facility] at [facility] Clinical Support from 10/25/2022 in [facility]  C-SSRS RISK CATEGORY No Risk No Risk No Risk     Assessment and Plan:   Bradley Hamilton is a 53 year old male with a past psychiatric history significant for psychosis (unspecified psychosis) and major depressive disorder who presents to [facility] for follow-up and medication management.  Patient presents to the encounter stating that he has been without his medications since having to leave his previous place of residence.  Since being without his medications, patient endorses ongoing  depression, anxiety, paranoia, and visual hallucinations.  A PHQ-9 screen was performed with patient scoring a 15.  A GAD-7 screen was also performed with the patient scoring a 21.  Patient reports that he would like to be placed back on his previously prescribed medications.  Provider recommended patient be placed on risperidone  1 mg at bedtime for 6 days, followed by 2 mg at bedtime for 6 more days, then continue taking 3 mg at bedtime for mood stability and management of his psychotic symptoms.  Provider also recommended patient be placed on fluoxetine  10 mg daily for 6 days, followed by 20 mg daily for the management of his depressive symptoms and anxiety.  Patient was agreeable to recommendations.  Patient's medications to be  e-prescribed to pharmacy of choice.  A [place] Suicide Severity Rating Scale was performed with the patient being considered no risk.  Patient denies suicidal ideations and is able to contract for safety at this time.   Collaboration of Care: Collaboration of Care: Medication Management AEB provider managing patient's psychiatric medications and Psychiatrist AEB patient being followed by a mental health provider at this facility  Patient/Guardian was advised Release of Information must be obtained prior to any record release in order to collaborate their care with an outside provider. Patient/Guardian was advised if they have not already done so to contact the registration department to sign all necessary forms in order for us  to release information regarding their care.   Consent: Patient/Guardian gives verbal consent for treatment and assignment of benefits for services provided during this visit. Patient/Guardian expressed understanding and agreed to proceed.   1. Psychosis, unspecified psychosis type (HCC) (Primary)  - risperiDONE  (RISPERDAL ) 3 MG tablet; Take 1 tablet (3 mg total) by mouth at bedtime.  Dispense: 30 tablet; Refill: 2 - risperiDONE  (RISPERDAL ) 1 MG  tablet; Take 1 tablet (1 mg total) by mouth at bedtime for 6 days, THEN 2 tablets (2 mg total) at bedtime for 6 days.  Dispense: 18 tablet; Refill: 0  2. Major depressive disorder, recurrent episode, moderate (HCC)  - FLUoxetine  (PROZAC ) 20 MG tablet; Take 1 tablet (20 mg total) by mouth daily.  Dispense: 30 tablet; Refill: 2 - FLUoxetine  (PROZAC ) 10 MG capsule; Patient to take fluoxetine  10 mg for 6 days then continue taking 20 mg daily.  Dispense: 6 capsule; Refill: 0  Patient to follow up in 7 weeks Provider spent a total of 21 minutes with the patient/reviewing the patient's chart  Reginia FORBES Bolster, PA 10/23/2023, 8:23 AM

## 2023-10-25 ENCOUNTER — Encounter (HOSPITAL_COMMUNITY): Payer: Self-pay | Admitting: Physician Assistant

## 2023-12-10 ENCOUNTER — Encounter (HOSPITAL_COMMUNITY): Payer: MEDICAID | Admitting: Physician Assistant
